# Patient Record
Sex: Male | Born: 1950 | Hispanic: No | Marital: Married | Smoking: Never smoker
Health system: Southern US, Community
[De-identification: ages and names within clinical notes are randomized; demographics above are authoritative.]

## PROBLEM LIST (undated history)

## (undated) DIAGNOSIS — I471 Supraventricular tachycardia, unspecified: Secondary | ICD-10-CM

## (undated) DIAGNOSIS — I4949 Other premature depolarization: Secondary | ICD-10-CM

## (undated) DIAGNOSIS — K5790 Diverticulosis of intestine, part unspecified, without perforation or abscess without bleeding: Secondary | ICD-10-CM

## (undated) DIAGNOSIS — K219 Gastro-esophageal reflux disease without esophagitis: Secondary | ICD-10-CM

## (undated) DIAGNOSIS — K824 Cholesterolosis of gallbladder: Secondary | ICD-10-CM

## (undated) HISTORY — DX: Supraventricular tachycardia: I47.1

## (undated) HISTORY — DX: Supraventricular tachycardia, unspecified: I47.10

## (undated) HISTORY — DX: Other premature depolarization: I49.49

## (undated) HISTORY — DX: Diverticulosis of intestine, part unspecified, without perforation or abscess without bleeding: K57.90

## (undated) HISTORY — PX: COLONOSCOPY: SHX174

## (undated) HISTORY — DX: Gastro-esophageal reflux disease without esophagitis: K21.9

## (undated) HISTORY — DX: Cholesterolosis of gallbladder: K82.4

## (undated) HISTORY — PX: OTHER SURGICAL HISTORY: SHX169

---

## 2001-09-06 ENCOUNTER — Encounter: Payer: Self-pay | Admitting: Family Medicine

## 2005-08-12 ENCOUNTER — Encounter: Payer: Self-pay | Admitting: Family Medicine

## 2008-04-03 ENCOUNTER — Encounter: Payer: Self-pay | Admitting: Family Medicine

## 2008-08-11 ENCOUNTER — Encounter: Payer: Self-pay | Admitting: Family Medicine

## 2009-03-13 ENCOUNTER — Encounter: Payer: Self-pay | Admitting: Family Medicine

## 2009-10-01 ENCOUNTER — Ambulatory Visit: Payer: Self-pay | Admitting: Family Medicine

## 2009-10-01 DIAGNOSIS — K5790 Diverticulosis of intestine, part unspecified, without perforation or abscess without bleeding: Secondary | ICD-10-CM

## 2009-10-01 DIAGNOSIS — K219 Gastro-esophageal reflux disease without esophagitis: Secondary | ICD-10-CM

## 2009-10-01 DIAGNOSIS — Z8719 Personal history of other diseases of the digestive system: Secondary | ICD-10-CM

## 2009-10-01 DIAGNOSIS — I4949 Other premature depolarization: Secondary | ICD-10-CM

## 2009-10-01 HISTORY — DX: Diverticulosis of intestine, part unspecified, without perforation or abscess without bleeding: K57.90

## 2009-10-01 HISTORY — DX: Other premature depolarization: I49.49

## 2009-10-01 HISTORY — DX: Gastro-esophageal reflux disease without esophagitis: K21.9

## 2009-10-08 ENCOUNTER — Ambulatory Visit: Payer: Self-pay | Admitting: Family Medicine

## 2010-04-05 ENCOUNTER — Ambulatory Visit: Payer: Self-pay | Admitting: Family Medicine

## 2010-04-05 LAB — CONVERTED CEMR LAB
Alkaline Phosphatase: 44 units/L (ref 39–117)
BUN: 15 mg/dL (ref 6–23)
Basophils Absolute: 0.1 10*3/uL (ref 0.0–0.1)
Basophils Relative: 1.1 % (ref 0.0–3.0)
Bilirubin, Direct: 0.3 mg/dL (ref 0.0–0.3)
Blood in Urine, dipstick: NEGATIVE
CO2: 29 meq/L (ref 19–32)
Calcium: 9.2 mg/dL (ref 8.4–10.5)
Cholesterol: 191 mg/dL (ref 0–200)
Creatinine, Ser: 0.8 mg/dL (ref 0.4–1.5)
Eosinophils Absolute: 0.1 10*3/uL (ref 0.0–0.7)
HDL: 59.5 mg/dL (ref >39.00)
LDL Cholesterol: 121 mg/dL — ABNORMAL HIGH (ref 0–99)
Lymphocytes Relative: 36 % (ref 12.0–46.0)
MCHC: 34.1 g/dL (ref 30.0–36.0)
Monocytes Absolute: 0.6 10*3/uL (ref 0.1–1.0)
Neutrophils Relative %: 50.2 % (ref 43.0–77.0)
Nitrite: NEGATIVE
PSA: 1.72 ng/mL (ref 0.10–4.00)
Platelets: 168 10*3/uL (ref 150.0–400.0)
RBC: 4.28 M/uL (ref 4.22–5.81)
Total Bilirubin: 2.6 mg/dL — ABNORMAL HIGH (ref 0.3–1.2)
Total CHOL/HDL Ratio: 3
Total Protein: 6.5 g/dL (ref 6.0–8.3)
Triglycerides: 51 mg/dL (ref 0.0–149.0)
Urobilinogen, UA: 0.2

## 2010-04-12 ENCOUNTER — Ambulatory Visit: Payer: Self-pay | Admitting: Family Medicine

## 2010-04-12 ENCOUNTER — Telehealth (INDEPENDENT_AMBULATORY_CARE_PROVIDER_SITE_OTHER): Payer: Self-pay | Admitting: *Deleted

## 2010-05-14 ENCOUNTER — Ambulatory Visit: Payer: Self-pay | Admitting: Family Medicine

## 2010-05-17 ENCOUNTER — Telehealth: Payer: Self-pay | Admitting: Internal Medicine

## 2010-05-18 ENCOUNTER — Telehealth: Payer: Self-pay | Admitting: Family Medicine

## 2010-05-19 ENCOUNTER — Ambulatory Visit: Payer: Self-pay | Admitting: Internal Medicine

## 2010-06-07 ENCOUNTER — Telehealth: Payer: Self-pay | Admitting: Family Medicine

## 2010-07-20 NOTE — Progress Notes (Signed)
Summary: appt with Dr Graciela Husbands  05/19/2010 at 9:15 am  Phone Note From Other Clinic Call back at Work Phone [phone]   Summary of Call: Spoke with Dr Johney Frame.  Pt had Holter monitor with ?runs of wider complex tachycardia.   Will set up EP referral and start metoprolol 25 mg two times a day  Initial call taken by: Evelena Peat MD,  May 17, 2010 2:04 PM  Follow-up for Phone Call        will schedule a NEP with Dr Graciela Husbands on [facility] 05/19/10.  Trying to reach pt now Dennis Bast, RN, BSN  May 17, 2010 2:15 PM  Called patient and left message on machine to call back about appointment and time  Sander Nephew, RN  Additional Follow-up for Phone Call Additional follow up Details #1::        Message left on work phone will sent Metoprolol to   pt pharmacy, begin med 2 times day. Additional Follow-up by: Sid Falcon LPN,  May 17, 2010 2:19 PM    Additional Follow-up for Phone Call Additional follow up Details #2::    pt rtn call Omer Jack  May 17, 2010 4:19 PM  pt confirm appt time.  Follow-up by: Claris Gladden RN,  May 18, 2010 4:11 PM  New/Updated Medications: METOPROLOL TARTRATE 25 MG TABS (METOPROLOL TARTRATE) one tab two times a day Prescriptions: METOPROLOL TARTRATE 25 MG TABS (METOPROLOL TARTRATE) one tab two times a day  #60 x 3   Entered by:   Sid Falcon LPN   Authorized by:   Evelena Peat MD   Signed by:   Sid Falcon LPN on 09/81/1914   Method used:   Electronically to        [id number] (retail)       [address]       [place], [place]  [address]       Ph: [phone]       Fax: [phone]   RxID:   [id number]

## 2010-07-20 NOTE — Letter (Signed)
Summary: [facility]   Imported By: Maryln Gottron 10/02/2009 14:16:30  _____________________________________________________________________  External Attachment:    Type:   Image     Comment:   External Document

## 2010-07-20 NOTE — Procedures (Signed)
Summary: Colonoscopy Report/[facility]  Colonoscopy Report/[facility]   Imported By: Maryln Gottron 10/02/2009 14:19:38  _____________________________________________________________________  External Attachment:    Type:   Image     Comment:   External Document

## 2010-07-20 NOTE — Assessment & Plan Note (Signed)
Summary: ?flu/njr   Vital Signs:  Patient profile:   60 year old male Weight:      153 pounds Temp:     99.6 degrees F oral BP sitting:   116 / 70  (left arm) Cuff size:   regular  Vitals Entered By: Kern Reap CMA Duncan Dull) (October 08, 2009 11:34 AM) CC: dry cough, sinus, congestion Is Patient Diabetic? No   CC:  dry cough, sinus, and congestion.  History of Present Illness: Andrew Griffith is a 60-year-old, married man nonsmoker, who comes in with a one-day history of fever, chills....... temperature 99.2........Marland Kitchen earache sore throat, nonproductive cough for one day.  Allergies: No Known Drug Allergies  Past History:  Past medical, surgical, family and social histories (including risk factors) reviewed for relevance to current acute and chronic problems.  Past Medical History: Reviewed history from 10/01/2009 and no changes required. Diverticulitis, hx of GERD Hay faver, allergies Heart arrhythmia  Family History: Reviewed history from 10/01/2009 and no changes required. Mother, breast cancer, emotional illness Father, lung cancer, elevated cholesterol, heart disease Grandparent, stroke, hypertension  Social History: Reviewed history from 10/01/2009 and no changes required. 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 Never Smoked Alcohol use-yes Regular exercise-yes  Review of Systems      See HPI  Physical Exam  General:  Well-developed,well-nourished,in no acute distress; alert,appropriate and cooperative throughout examination Head:  Normocephalic and atraumatic without obvious abnormalities. No apparent alopecia or balding. Eyes:  No corneal or conjunctival inflammation noted. EOMI. Perrla. Funduscopic exam benign, without hemorrhages, exudates or papilledema. Vision grossly normal. Ears:  External ear exam shows no significant lesions or deformities.  Otoscopic examination reveals clear canals, tympanic membranes are intact bilaterally without bulging, retraction,  inflammation or discharge. Hearing is grossly normal bilaterally. Nose:  External nasal examination shows no deformity or inflammation. Nasal mucosa are pink and moist without lesions or exudates. Mouth:  Oral mucosa and oropharynx without lesions or exudates.  Teeth in good repair. Neck:  No deformities, masses, or tenderness noted. Chest Wall:  No deformities, masses, tenderness or gynecomastia noted. Lungs:  Normal respiratory effort, chest expands symmetrically. Lungs are clear to auscultation, no crackles or wheezes.   Problems:  Medical Problems Added: 1)  Dx of Viral Infection-unspec  (ICD-079.99)  Impression & Recommendations:  Problem # 1:  VIRAL INFECTION-UNSPEC (ICD-079.99) Assessment New  His updated medication list for this problem includes:    Diclofenac Sodium 75 Mg Tbec (Diclofenac sodium) .Marland Kitchen... As needed    Hydromet 5-1.5 Mg/38ml Syrp (Hydrocodone-homatropine) .Marland Kitchen... 1 or 2 tsps three times a day as needed  Complete Medication List: 1)  Omeprazole 20 Mg Tbec (Omeprazole) .... Once daily 2)  Ranitidine Hcl 150 Mg Caps (Ranitidine hcl) .... As needed 3)  Diclofenac Sodium 75 Mg Tbec (Diclofenac sodium) .... As needed 4)  Hydromet 5-1.5 Mg/18ml Syrp (Hydrocodone-homatropine) .Marland Kitchen.. 1 or 2 tsps three times a day as needed  Patient Instructions: 1)  drink 20 to 30 ounces of water daily, run a vaporizer or humidifier in y   bedroom at night, 2)  Hydromet one to 2 teaspoons 3 times a day as needed for cough and cold.  Return p.r.n. Prescriptions: HYDROMET 5-1.5 MG/5ML SYRP (HYDROCODONE-HOMATROPINE) 1 or 2 tsps three times a day as needed  #8oz x 1   Entered and Authorized by:   Roderick Pee MD   Signed by:   Roderick Pee MD on 10/08/2009   Method used:   Print then Give to Patient  RxID:   [id number]

## 2010-07-20 NOTE — Progress Notes (Signed)
Summary: 48 hr holter monitor  Phone Note Outgoing Call Call back at [facility] Phone [phone]   Call placed by: Stanton Kidney, EMT-P,  April 12, 2010 5:14 PM Summary of Call: Left message for Pt. to call back to schedule for holter monitor. Stanton Kidney, EMT-P  April 12, 2010 5:15 PM  Pt scheduled for 05/07/10 at 4pm for 48 hr holter. Stanton Kidney, EMT-P  April 13, 2010 5:07 PM

## 2010-07-20 NOTE — Assessment & Plan Note (Signed)
Summary: cpx/njr rsc bmp/njr----PT [facility] // RS   Vital Signs:  Patient profile:   60 year old male Height:      70 inches Weight:      155 pounds Temp:     98.2 degrees F oral Pulse rate:   60 / minute Pulse rhythm:   regular Resp:     12 per minute BP sitting:   110 / 70  (left arm) Cuff size:   regular  Vitals Entered By: Sid Falcon LPN (April 12, 2010 2:10 PM)  History of Present Illness: Here for CPE.  PMH, SH, AND FH  reviewed. Inconsistent exercise.  Pt has had several months if not years of palpitations.  Sometimes has sensation of rapid  beat and sometimes slow heart rate.  No clear triggers.  minimal caffeine use. No syncope, chest pain, or any severe dizziness.  No clear exacerbating factors. Prior EKGs unremarkable.  No hx of Holter or any other workup.  Symptoms seem to be  progressive in nature.  His father did have CAD at 43 but, again, pt with no symptoms of  chest pain.  Clinical Review Panels:  Prevention   Last Colonoscopy:  normal (09/18/2001)   Last PSA:  1.72 (04/05/2010)  Immunizations   Last Tetanus Booster:  Historical (09/19/2003)  Lipid Management   Cholesterol:  191 (04/05/2010)   LDL (bad choesterol):  121 (04/05/2010)   HDL (good cholesterol):  59.50 (04/05/2010)  Diabetes Management   Creatinine:  0.8 (04/05/2010)  CBC   WBC:  5.6 (04/05/2010)   RBC:  4.28 (04/05/2010)   Hgb:  14.6 (04/05/2010)   Hct:  42.8 (04/05/2010)   Platelets:  168.0 (04/05/2010)   MCV  100.0 (04/05/2010)   MCHC  34.1 (04/05/2010)   RDW  12.6 (04/05/2010)   PMN:  50.2 (04/05/2010)   Lymphs:  36.0 (04/05/2010)   Monos:  11.4 (04/05/2010)   Eosinophils:  1.3 (04/05/2010)   Basophil:  1.1 (04/05/2010)  Complete Metabolic Panel   Glucose:  80 (04/05/2010)   Sodium:  140 (04/05/2010)   Potassium:  4.9 (04/05/2010)   Chloride:  103 (04/05/2010)   CO2:  29 (04/05/2010)   BUN:  15 (04/05/2010)   Creatinine:  0.8 (04/05/2010)   Albumin:  4.2  (04/05/2010)   Total Protein:  6.5 (04/05/2010)   Calcium:  9.2 (04/05/2010)   Total Bili:  2.6 (04/05/2010)   Alk Phos:  44 (04/05/2010)   SGPT (ALT):  21 (04/05/2010)   SGOT (AST):  21 (04/05/2010)   Allergies (verified): No Known Drug Allergies  Past History:  Past Medical History: Last updated: 10/01/2009 Diverticulitis, hx of GERD Hay faver, allergies Heart arrhythmia  Family History: Last updated: 04/12/2010 Mother, breast cancer, emotional illness Father, lung cancer, elevated cholesterol, heart disease (age 65) Grandparent, stroke, hypertension  Social History: Last updated: 10/01/2009 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 Never Smoked Alcohol use-yes Regular exercise-yes  Risk Factors: Exercise: yes (10/01/2009)  Risk Factors: Smoking Status: never (10/01/2009) PMH-FH-SH reviewed for relevance  Family History: Mother, breast cancer, emotional illness Father, lung cancer, elevated cholesterol, heart disease (age 31) Grandparent, stroke, hypertension  Review of Systems  The patient denies anorexia, fever, weight loss, weight gain, vision loss, decreased hearing, hoarseness, chest pain, syncope, dyspnea on exertion, peripheral edema, prolonged cough, headaches, hemoptysis, abdominal pain, melena, hematochezia, severe indigestion/heartburn, hematuria, incontinence, genital sores, muscle weakness, suspicious skin lesions, transient blindness, difficulty walking, depression, unusual weight change, abnormal bleeding, enlarged lymph nodes, and testicular masses.  symptoms of heart irregularity off and on.    No assoc dizziness or syncope.  Symptoms have progressed recently.   Physical Exam  General:  Well-developed,well-nourished,in no acute distress; alert,appropriate and cooperative throughout examination Head:  Normocephalic and atraumatic without obvious abnormalities. No apparent alopecia or balding. Eyes:  No corneal or conjunctival inflammation  noted. EOMI. Perrla. Funduscopic exam benign, without hemorrhages, exudates or papilledema. Vision grossly normal. Ears:  External ear exam shows no significant lesions or deformities.  Otoscopic examination reveals clear canals, tympanic membranes are intact bilaterally without bulging, retraction, inflammation or discharge. Hearing is grossly normal bilaterally. Mouth:  Oral mucosa and oropharynx without lesions or exudates.  Teeth in good repair. Neck:  No deformities, masses, or tenderness noted. Lungs:  Normal respiratory effort, chest expands symmetrically. Lungs are clear to auscultation, no crackles or wheezes. Heart:  Normal rate and regular rhythm. S1 and S2 normal without gallop, murmur, click, rub or other extra sounds. Abdomen:  Bowel sounds positive,abdomen soft and non-tender without masses, organomegaly or hernias noted. Rectal:  No external abnormalities noted. Normal sphincter tone. No rectal masses or tenderness. Prostate:  Prostate gland firm and smooth, no enlargement, nodularity, tenderness, mass, asymmetry or induration. Msk:  No deformity or scoliosis noted of thoracic or lumbar spine.   Extremities:  No clubbing, cyanosis, edema, or deformity noted with normal full range of motion of all joints.   Neurologic:  alert & oriented X3 and cranial nerves II-XII intact.   Skin:  no rashes and no suspicious lesions.   Cervical Nodes:  No lymphadenopathy noted Psych:  Cognition and judgment appear intact. Alert and cooperative with normal attention span and concentration. No apparent delusions, illusions, hallucinations   Impression & Recommendations:  Problem # 1:  Preventive Health Care (ICD-V70.0) discussed exercise and immuinizations reviewed.  Declines flu.  He will check on coverage for  Shingles vaccine.  HEmoccults given and will need repeat colonoscopy in 2 years.  Problem # 2:  PALPITATIONS (ICD-785.1) Assessment: Deteriorated set up event monitor given progression  of symptoms.  Limit caffeine.  Suspect symptomatic PVCs. Orders: Cardiology Referral (Cardiology)  Complete Medication List: 1)  Omeprazole 20 Mg Tbec (Omeprazole) .... Once daily 2)  Ranitidine Hcl 150 Mg Caps (Ranitidine hcl) .... As needed 3)  Diclofenac Sodium 75 Mg Tbec (Diclofenac sodium) .... As needed  Patient Instructions: 1)  Check on coverage for shingles vaccine (Zostavax) 2)  It is important that you exercise reguarly at least 20 minutes 5 times a week. If you develop chest pain, have severe difficulty breathing, or feel very tired, stop exercising immediately and seek medical attention.    Orders Added: 1)  Est. Patient 40-64 years [99396] 2)  Cardiology Referral [Cardiology] 3)  Est. Patient Level III [09811]

## 2010-07-20 NOTE — Procedures (Signed)
Summary: summary report  summary report   Imported By: Mirna Mires 05/19/2010 13:50:39  _____________________________________________________________________  External Attachment:    Type:   Image     Comment:   External Document

## 2010-07-20 NOTE — Assessment & Plan Note (Signed)
Summary: BRAND NEW PT/TO EST/CJR   Vital Signs:  Patient profile:   60 year old male Height:      70 inches Weight:      154 pounds BMI:     22.18 Temp:     98.2 degrees F oral Pulse rate:   60 / minute Pulse rhythm:   regular Resp:     12 per minute BP sitting:   122 / 86  (left arm) Cuff size:   regular  Vitals Entered By: Sid Falcon LPN (October 01, 2009 9:20 AM) CC: New to establish   History of Present Illness: New patient to establish care  Past medical history significant for GERD, diverticulitis and possibly heart arrhythmia (which sounds like some PVCs). This has never been evaluated. No progressive symptoms. Rare episodes of palpitations not associated with chest pain, tachycardia, dizziness. Reflux well controlled with omeprazole and supplements with Zantac as needed.  ? episode of acute diverticulitis few years ago but apparently not confirmed by CT scan.  Dxed clinically. Family history significant mother breast cancer, father lung cancer. Father had coronary disease age 42.  Patient is married and has 2 grown sons. No history of smoking. Occ alcohol use. 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   Preventive Screening-Counseling & Management  Alcohol-Tobacco     Smoking Status: never  Caffeine-Diet-Exercise     Does Patient Exercise: yes  Allergies (verified): No Known Drug Allergies  Past History:  Family History: Last updated: 10/01/2009 Mother, breast cancer, emotional illness Father, lung cancer, elevated cholesterol, heart disease Grandparent, stroke, hypertension  Social History: Last updated: 10/01/2009 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 Never Smoked Alcohol use-yes Regular exercise-yes  Risk Factors: Exercise: yes (10/01/2009)  Risk Factors: Smoking Status: never (10/01/2009)  Past Medical History: Diverticulitis, hx of GERD Hay faver, allergies Heart arrhythmia  Family History: Mother, breast cancer, emotional  illness Father, lung cancer, elevated cholesterol, heart disease Grandparent, stroke, hypertension  Social History: 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    Physical Exam  General:  Well-developed,well-nourished,in no acute distress; alert,appropriate and cooperative throughout examination Head:  Normocephalic and atraumatic without obvious abnormalities. No apparent alopecia or balding. Eyes:  No corneal or conjunctival inflammation noted. EOMI. Perrla. Funduscopic exam benign, without hemorrhages, exudates or papilledema. Vision grossly normal. Ears:  External ear exam shows no significant lesions or deformities.  Otoscopic examination reveals clear canals, tympanic membranes are intact bilaterally without bulging, retraction, inflammation or discharge. Hearing is grossly normal bilaterally. Mouth:  Oral mucosa and oropharynx without lesions or exudates.  Teeth in good repair. Neck:  No deformities, masses, or tenderness noted. Lungs:  Normal respiratory effort, chest expands symmetrically. Lungs are clear to auscultation, no crackles or wheezes. Heart:  Normal rate and regular rhythm. S1 and S2 normal without gallop, murmur, click, rub or other extra sounds. Abdomen:  Bowel sounds positive,abdomen soft and non-tender without masses, organomegaly or hernias noted. Extremities:  No clubbing, cyanosis, edema, or deformity noted with normal full range of motion of all joints.   Neurologic:  No cranial nerve deficits noted.  Station and gait are normal. Plantar reflexes are down-going bilaterally.  DTRs are symmetrical throughout. Sensory, motor and coordinative functions appear intact. Skin:  Intact without suspicious lesions or rashes Cervical Nodes:  No lymphadenopathy noted Psych:  good eye contact, not anxious appearing, and not depressed appearing.     Impression & Recommendations:  Problem # 1:  GERD (ICD-530.81)  His updated medication list for this problem includes:    Omeprazole 20 Mg Tbec (Omeprazole) ..... Once daily    Ranitidine Hcl 150 Mg Caps (Ranitidine hcl) .Marland Kitchen... 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  Avoid excessive caffeine use.  Problem # 4:  Preventive Health Care (ICD-V70.0) last CPE 9/10.  Schedule CPE.  Complete Medication List: 1)  Omeprazole 20 Mg Tbec (Omeprazole) .... Once daily 2)  Ranitidine Hcl 150 Mg Caps (Ranitidine hcl) .... As needed 3)  Diclofenac Sodium 75 Mg Tbec (Diclofenac sodium) .... As needed  Patient Instructions: 1)  Schedule complete physical examination in 5-6 months 2)  It is important that you exercise reguarly at least 20 minutes 5 times a week. If you develop chest pain, have severe difficulty breathing, or feel very tired, stop exercising immediately and seek medical attention.   Preventive Care Screening  Last Tetanus Booster:    Date:  09/19/2003    Results:  Historical   Colonoscopy:    Date:  09/18/2001    Results:  normal     Preventive Care Screening  Last Tetanus Booster:    Date:  09/19/2003    Results:  Historical   Colonoscopy:    Date:  09/18/2001    Results:  normal

## 2010-07-20 NOTE — Assessment & Plan Note (Signed)
Summary: wide complex tach on monitor/kl   Primary Provider:  Evelena Peat MD  CC:  Follow up on Holter results; No complaints.  History of Present Illness: [name] is seen at the request of Dr. Caryl Never because of tachycardia palpitations and a Holter monitor documenting supraventricular tachycardia.  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 They are frog negative and are typically triggered by exertion and relieved by stopping exercise. There also provoked by caffeine. He is not use over-the-counter cold medicines.  He has not been previously documented. He did undergo an ultrasound he thinks in [place] 8-10 years ago. He does not recall whether this is normal or not normal.  Otherwise he denies exercise induced chest discomfort or intolerance. He does not have nocturnal dyspnea orthopnea or peripheral edema.  Problems Prior to Update: 1)  Routine General Medical Exam[facility]  (ICD-V70.0) 2)  Palpitations  (ICD-785.1) 3)  Premature Ventricular Contractions  (ICD-427.69) 4)  Gerd  (ICD-530.81) 5)  Diverticulitis, Hx of  (ICD-V12.79)  Medications Prior to Update: 1)  Omeprazole 20 Mg Tbec (Omeprazole) .... Once Daily 2)  Ranitidine Hcl 150 Mg Caps (Ranitidine Hcl) .... As Needed 3)  Diclofenac Sodium 75 Mg Tbec (Diclofenac Sodium) .... As Needed 4)  Metoprolol Tartrate 25 Mg Tabs (Metoprolol Tartrate) .... One Tab Two Times A Day  Current Medications (verified): 1)  Omeprazole 20 Mg Tbec (Omeprazole) .... Once Daily 2)  Ranitidine Hcl 150 Mg Caps (Ranitidine Hcl) .... As Needed 3)  Diclofenac Sodium 75 Mg Tbec (Diclofenac Sodium) .... As Needed 4)  Metoprolol Tartrate 25 Mg Tabs (Metoprolol Tartrate) .... One Tab Two Times A Day (Not Started Yet) 5)  Aspirin 81 Mg Tbec (Aspirin) .... Take  One Tablet By Mouth Daily  Allergies (verified): No Known Drug Allergies  Past History:  Past Medical History: Last updated: 10/01/2009 Diverticulitis, hx of GERD Hay faver, allergies Heart arrhythmia  Family History: Last updated: 04/12/2010 Mother, breast cancer, emotional illness Father, lung cancer, elevated cholesterol, heart disease (age 2) Grandparent, stroke, hypertension  Social History: Last updated: 10/01/2009 Occupation:  [employer] Married Never Smoked Alcohol use-yes Regular exercise-yes  Risk Factors: Exercise: yes (10/01/2009)  Risk Factors: Smoking Status: never (10/01/2009)  Review of Systems       full review of systems was negative apart from a history of present illness and past medical history.   Vital Signs:  Patient profile:   60 year old male Height:      70 inches Weight:      155.75 pounds BMI:     22.43 Pulse rate:   50 / minute BP sitting:   110 / 58  (left arm) Cuff size:   regular  Vitals Entered By: Stanton Kidney, EMT-P (May 19, 2010 9:34 AM)  Physical Exam  General:  Well developed, well nourished,older Caucasian male appearing his stated age in no acute distress. Head:  normal HEENT Neck:  supple without thyromegaly Chest Wall:  without kyphosis scoliosis or CVA tenderness Lungs:  clear to auscultation Heart:  regular rate and rhythm without murmurs or gallops Abdomen:  soft nontender without midline pulsation or hepatomegaly Msk:  without obvious skeletal deformity Pulses:  intact distal pulses Extremities:  no clubbing cyanosis or edema Neurologic:  alert and oriented and grossly normal sensory and motor function Skin:  warm  and dry and without rashes Cervical Nodes:  without adenopathy Psych:  normal affect   EKG  Procedure date:  05/19/2010  Findings:      sinus rhythm without evidence of ventricular preexcitation  Holter Monitor  Procedure date:  05/19/2010  Findings:      the patient has  documented wide-complex tachycardia giving rise to a narrow complex tachycardia at the same cycle length. This confirms both SVT and suggested this is a right-sided or septal pathway.  Impression & Recommendations:  Problem # 1:  SVT (ICD-427.0) the patient has documented supraventricular tachycardia. It initiates as a wide-complex tachycardia and then transitions to a narrow QRS tachycardia of the same cycle length. This both confirmed the diagnosis of SVT as well suggests there is a right-sided/septal pathway.   I reviewed with him the potential therapies including beta blockers and her calcium blockers which are somewhat of a problem because of baseline bradycardia as well as antiarrhythmic drug therapy with the possibility of arrhythmia and catheter ablation. Given his minimal symptoms at this point notwithstanding his rapid rate, he would like to do nothing ; I think that this is a reasonable strategy. We will plan to reassess things in 6 months. His updated medication list for this problem includes:    Metoprolol Tartrate 25 Mg Tabs (Metoprolol tartrate) ..... One tab two times a day (not started yet)    Aspirin 81 Mg Tbec (Aspirin) .Marland Kitchen... Take one tablet by mouth daily  Problem # 2:  SINUS BRADYCARDIA (ICD-427.81) as above His updated medication list for this problem includes:    Metoprolol Tartrate 25 Mg Tabs (Metoprolol tartrate) ..... One tab two times a day (not started yet)    Aspirin 81 Mg Tbec (Aspirin) .Marland Kitchen... Take one tablet by mouth daily  Patient Instructions: 1)  Your physician recommends that you continue on your current medications as directed. Please refer to the Current Medication list given to you today. 2)  Your physician wants you to follow-up in:  6 months with Dr. Graciela Husbands. You will receive a reminder letter in the mail two months in advance. If you don't receive a letter, please call our office to schedule the follow-up appointment.

## 2010-07-20 NOTE — Progress Notes (Signed)
Summary: call ASAP please  Phone Note Call from Patient   Caller: Patient Call For: Evelena Peat MD Summary of Call: Pt has not been able to touch base with Dr. Caryl Never or the Cardiologist regarding his recent testing and appts.  Please call ASAP with any information. [phone] Initial call taken by: Lynann Beaver CMA AAMA,  May 18, 2010 11:18 AM  Follow-up for Phone Call        I called pt and made him aware of his appt with Dr. Graciela Husbands on 11/30. I asked him if he still wanted to talk with Dr. Caryl Never and he said no, he will wait and see what Dr. Graciela Husbands says. Follow-up by: Corky Mull,  May 18, 2010 12:17 PM

## 2010-07-22 NOTE — Progress Notes (Signed)
Summary: Omeprazole to [facility] X 1 year  Phone Note Call from Patient   Summary of Call: [place] physican prescribed Omeprazole, moved here and needs refill to [facility].  Spoke with pt, faxed Rx Initial call taken by: Sid Falcon LPN,  June 07, 2010 2:03 PM    Prescriptions: OMEPRAZOLE 20 MG TBEC (OMEPRAZOLE) once daily  #90 x 3   Entered by:   Sid Falcon LPN   Authorized by:   Evelena Peat MD   Signed by:   Sid Falcon LPN on 04/54/0981   Method used:   Faxed to ...       [facility] (mail-order)             , [place]         Ph: [phone]       Fax: [phone]   RxID:   [id number]

## 2011-02-04 ENCOUNTER — Encounter: Payer: Self-pay | Admitting: Family Medicine

## 2011-02-07 ENCOUNTER — Encounter: Payer: Self-pay | Admitting: Family Medicine

## 2011-02-07 ENCOUNTER — Ambulatory Visit (INDEPENDENT_AMBULATORY_CARE_PROVIDER_SITE_OTHER): Payer: 59 | Admitting: Family Medicine

## 2011-02-07 ENCOUNTER — Ambulatory Visit: Payer: Self-pay | Admitting: Family Medicine

## 2011-02-07 VITALS — BP 110/70 | Temp 98.7°F | Wt 154.0 lb

## 2011-02-07 DIAGNOSIS — M545 Low back pain: Secondary | ICD-10-CM

## 2011-02-07 MED ORDER — DICLOFENAC SODIUM 75 MG PO TBEC: 75.0000 mg | DELAYED_RELEASE_TABLET | Freq: Two times a day (BID) | ORAL | Status: DC

## 2011-02-07 NOTE — Progress Notes (Signed)
  Subjective:    Patient ID: Andrew Griffith, male    DOB: May 11, 1951, 60 y.o.   MRN: [id number]  HPI Low back, hip and leg pain for the past few months. Symptoms are not progressive. Long history of intermittent back difficulties. Denies any urine incontinence, weakness, or numbness. He is able to tolerate walking fairly well. Also exercising with elliptical. Pain is starting to wake up occasionally. He denies any fever or chills. No history of cancer. No appetite or weight changes. Symptoms are mild to moderate in terms of severity   Review of Systems  Constitutional: Negative for fever, activity change and appetite change.  Respiratory: Negative for cough and shortness of breath.   Cardiovascular: Negative for chest pain and leg swelling.  Gastrointestinal: Negative for vomiting and abdominal pain.  Genitourinary: Negative for dysuria, hematuria and flank pain.  Musculoskeletal: Positive for back pain. Negative for joint swelling.  Neurological: Negative for weakness and numbness.       Objective:   Physical Exam  Constitutional: He appears well-developed and well-nourished.  Cardiovascular: Normal rate and regular rhythm.   Pulmonary/Chest: Effort normal and breath sounds normal. No respiratory distress. He has no wheezes. He has no rales.  Musculoskeletal: He exhibits no edema.       Straight leg raise is negative bilaterally. Left hip reveals excellent range of motion. No lateral hip tenderness. Back is nontender to palpation with good range of motion  Neurological:       No weakness lower extremities. Deep tender reflexes symmetric knee and ankle. No sensory impairment          Assessment & Plan:  Low back pain with left lower extremity pain. Suspect he does have some radiculopathy symptoms but nonfocal neurologic exam. Diclofenac 75 mg twice daily with food. Aerobic exercise as tolerated. Reviewed extension stretches. Touch base 2 weeks if no better

## 2011-02-07 NOTE — Patient Instructions (Signed)
Touch base in 2 weeks if no better and sooner for any worsening symptoms.

## 2011-04-25 ENCOUNTER — Encounter: Payer: Self-pay | Admitting: Internal Medicine

## 2011-04-25 ENCOUNTER — Ambulatory Visit (INDEPENDENT_AMBULATORY_CARE_PROVIDER_SITE_OTHER): Payer: 59 | Admitting: Internal Medicine

## 2011-04-25 VITALS — BP 132/76 | HR 52 | Resp 18 | Ht 70.0 in | Wt 155.1 lb

## 2011-04-25 DIAGNOSIS — I471 Supraventricular tachycardia, unspecified: Secondary | ICD-10-CM | POA: Insufficient documentation

## 2011-04-25 NOTE — Progress Notes (Signed)
  HPI  Andrew Griffith is a 60 y.o. male Seen in followup for recurrent tachycardia palpitations with documented SVT. These have been increasingly frequent occurring a couple times a week lasting only a minute or so. They are mildly symptomatic but without symptoms of lightheadedness or presyncope  Past Medical History  Diagnosis Date  . PREMATURE VENTRICULAR CONTRACTIONS 10/01/2009  . GERD 10/01/2009  . DIVERTICULITIS, HX OF 10/01/2009    No past surgical history on file.  Current Outpatient Prescriptions  Medication Sig Dispense Refill  . aspirin 81 MG tablet Take 81 mg by mouth daily.        Marland Kitchen omeprazole (PRILOSEC) 20 MG capsule Take 20 mg by mouth daily.        . ranitidine (ZANTAC) 150 MG capsule Take 150 mg by mouth 2 (two) times daily as needed.          No Known Allergies  Review of Systems negative except from HPI and PMH  Physical Exam Well developed and well nourished in no acute distress HENT normal E scleral and icterus clear Neck Supple JVP flat; carotids brisk and full Clear to ausculation Regular rate and rhythm, no murmurs gallops or rub Soft with active bowel sounds No clubbing cyanosis and edema Alert and oriented, grossly normal motor and sensory function Skin Warm and Dry  ECG  Assessment and  Plan

## 2011-04-25 NOTE — Assessment & Plan Note (Addendum)
TheseThese have been recurring. We have reviewed vagal maneuvers. We have reviewed the benign nature of these palpitations. He will let us know what he would like to do.  We reviewed treatment options  AV nodal blocking agents are precluded by his resting bradycardia. Antiarrhythmic therapy and the potential for poor arrhythmia as well as catheter ablation

## 2011-04-27 ENCOUNTER — Other Ambulatory Visit (INDEPENDENT_AMBULATORY_CARE_PROVIDER_SITE_OTHER): Payer: 59

## 2011-04-27 DIAGNOSIS — Z0000 Encounter for general adult medical examination without abnormal findings: Secondary | ICD-10-CM

## 2011-04-27 LAB — LIPID PANEL
Cholesterol: 205 mg/dL — ABNORMAL HIGH (ref 0–200)
HDL: 65.4 mg/dL (ref >39.00)
Total CHOL/HDL Ratio: 3
Triglycerides: 48 mg/dL (ref 0.0–149.0)
VLDL: 9.6 mg/dL (ref 0.0–40.0)

## 2011-04-27 LAB — POCT URINALYSIS DIPSTICK
Blood, UA: NEGATIVE
Glucose, UA: NEGATIVE
Nitrite, UA: NEGATIVE
Protein, UA: NEGATIVE
Spec Grav, UA: 1.025
Urobilinogen, UA: 0.2

## 2011-04-27 LAB — CBC WITH DIFFERENTIAL/PLATELET
Basophils Absolute: 0 10*3/uL (ref 0.0–0.1)
Eosinophils Absolute: 0.1 10*3/uL (ref 0.0–0.7)
Eosinophils Relative: 1.1 % (ref 0.0–5.0)
HCT: 45.6 % (ref 39.0–52.0)
Lymphs Abs: 1.7 10*3/uL (ref 0.7–4.0)
MCV: 100.8 fl — ABNORMAL HIGH (ref 78.0–100.0)
Monocytes Absolute: 0.6 10*3/uL (ref 0.1–1.0)
Neutrophils Relative %: 53.2 % (ref 43.0–77.0)
Platelets: 182 10*3/uL (ref 150.0–400.0)
RDW: 12.8 % (ref 11.5–14.6)
WBC: 5 10*3/uL (ref 4.5–10.5)

## 2011-04-27 LAB — BASIC METABOLIC PANEL
BUN: 18 mg/dL (ref 6–23)
Chloride: 102 mEq/L (ref 96–112)
Creatinine, Ser: 0.9 mg/dL (ref 0.4–1.5)
Glucose, Bld: 91 mg/dL (ref 70–99)

## 2011-04-27 LAB — TSH: TSH: 0.98 u[IU]/mL (ref 0.35–5.50)

## 2011-04-27 LAB — HEPATIC FUNCTION PANEL
Bilirubin, Direct: 0.2 mg/dL (ref 0.0–0.3)
Total Bilirubin: 2.1 mg/dL — ABNORMAL HIGH (ref 0.3–1.2)

## 2011-04-29 ENCOUNTER — Other Ambulatory Visit: Payer: 59

## 2011-05-04 ENCOUNTER — Encounter: Payer: 59 | Admitting: Family Medicine

## 2011-05-06 ENCOUNTER — Encounter: Payer: 59 | Admitting: Family Medicine

## 2011-05-11 ENCOUNTER — Ambulatory Visit (INDEPENDENT_AMBULATORY_CARE_PROVIDER_SITE_OTHER): Payer: 59 | Admitting: Family Medicine

## 2011-05-11 ENCOUNTER — Encounter: Payer: Self-pay | Admitting: Family Medicine

## 2011-05-11 VITALS — BP 118/80 | HR 60 | Temp 98.3°F | Resp 12 | Ht 70.5 in | Wt 155.0 lb

## 2011-05-11 DIAGNOSIS — Z0000 Encounter for general adult medical examination without abnormal findings: Secondary | ICD-10-CM

## 2011-05-11 NOTE — Patient Instructions (Signed)
Check on coverage for shingles vaccine. We will be setting up referral for colonoscopy.

## 2011-05-11 NOTE — Progress Notes (Signed)
  Subjective:    Patient ID: Andrew Griffith, male    DOB: 04-13-1951, 60 y.o.   MRN: [id number]  HPI  Patient seen for complete physical. He has history of GERD and has had history of paroxysmal supraventricular tachycardia. No recent episodes. Has seen cardiologist. Consideration for low-dose beta blocker but he tends to have low resting pulse. He has been exercising regularly and has not had any problems recently with syncope, dizziness, or any chest pain.  Tetanus up to date. No history of shingles vaccine. Flu vaccine up-to-date. Colonoscopy 2003 and due for repeat soon. He would like to go ahead and schedule.  Past Medical History  Diagnosis Date  . PREMATURE VENTRICULAR CONTRACTIONS 10/01/2009  . GERD 10/01/2009  . DIVERTICULITIS, HX OF 10/01/2009  . Paroxysmal supraventricular tachycardia    No past surgical history on file.  reports that he has never smoked. He does not have any smokeless tobacco history on file. His alcohol and drug histories not on file. family history includes Cancer in his father and mother; Cancer (age of onset:70) in his sister; Heart disease (age of onset:51) in his father; Hyperlipidemia in his father; Hypertension in his other; and Stroke in his other. No Known Allergies   Review of Systems  Constitutional: Negative for fever, activity change, appetite change and fatigue.  HENT: Negative for ear pain, congestion and trouble swallowing.   Eyes: Negative for pain and visual disturbance.  Respiratory: Negative for cough, shortness of breath and wheezing.   Cardiovascular: Negative for chest pain and palpitations.  Gastrointestinal: Negative for nausea, vomiting, abdominal pain, diarrhea, constipation, blood in stool, abdominal distention and rectal pain.  Genitourinary: Negative for dysuria, hematuria and testicular pain.  Musculoskeletal: Negative for joint swelling and arthralgias.  Skin: Negative for rash.  Neurological: Negative for dizziness, syncope and  headaches.  Hematological: Negative for adenopathy.  Psychiatric/Behavioral: Negative for confusion and dysphoric mood.       Objective:   Physical Exam  Constitutional: He is oriented to person, place, and time. He appears well-developed and well-nourished. No distress.  HENT:  Head: Normocephalic and atraumatic.  Right Ear: External ear normal.  Left Ear: External ear normal.  Mouth/Throat: Oropharynx is clear and moist.  Eyes: Conjunctivae and EOM are normal. Pupils are equal, round, and reactive to light.  Neck: Normal range of motion. Neck supple. No thyromegaly present.  Cardiovascular: Normal rate, regular rhythm and normal heart sounds.   No murmur heard. Pulmonary/Chest: No respiratory distress. He has no wheezes. He has no rales.  Abdominal: Soft. Bowel sounds are normal. He exhibits no distension and no mass. There is no tenderness. There is no rebound and no guarding.  Musculoskeletal: He exhibits no edema.  Lymphadenopathy:    He has no cervical adenopathy.  Neurological: He is alert and oriented to person, place, and time. He displays normal reflexes. No cranial nerve deficit.  Skin: No rash noted.  Psychiatric: He has a normal mood and affect.          Assessment & Plan:  Health maintenance. Labs reviewed with patient all basically favorable. Check on coverage for shingles vaccine and return for that if patient desires. Schedule repeat colonoscopy for April 2013 which will make 10 year interval.

## 2011-06-02 ENCOUNTER — Ambulatory Visit (INDEPENDENT_AMBULATORY_CARE_PROVIDER_SITE_OTHER): Payer: 59 | Admitting: Family Medicine

## 2011-06-02 DIAGNOSIS — Z23 Encounter for immunization: Secondary | ICD-10-CM

## 2011-09-23 ENCOUNTER — Encounter: Payer: Self-pay | Admitting: Gastroenterology

## 2011-10-19 ENCOUNTER — Ambulatory Visit (AMBULATORY_SURGERY_CENTER): Payer: 59 | Admitting: *Deleted

## 2011-10-19 ENCOUNTER — Encounter: Payer: Self-pay | Admitting: Gastroenterology

## 2011-10-19 VITALS — Ht 70.0 in | Wt 154.1 lb

## 2011-10-19 DIAGNOSIS — Z1211 Encounter for screening for malignant neoplasm of colon: Secondary | ICD-10-CM

## 2011-10-19 MED ORDER — PEG-KCL-NACL-NASULF-NA ASC-C 100 G PO SOLR: ORAL | Status: DC

## 2011-11-02 ENCOUNTER — Encounter: Payer: 59 | Admitting: Gastroenterology

## 2011-11-04 ENCOUNTER — Ambulatory Visit (AMBULATORY_SURGERY_CENTER): Payer: 59 | Admitting: Gastroenterology

## 2011-11-04 ENCOUNTER — Encounter: Payer: Self-pay | Admitting: Gastroenterology

## 2011-11-04 VITALS — BP 120/74 | HR 58 | Temp 97.9°F | Resp 12 | Ht 70.0 in | Wt 154.0 lb

## 2011-11-04 DIAGNOSIS — K5730 Diverticulosis of large intestine without perforation or abscess without bleeding: Secondary | ICD-10-CM

## 2011-11-04 DIAGNOSIS — Z1211 Encounter for screening for malignant neoplasm of colon: Secondary | ICD-10-CM

## 2011-11-04 MED ORDER — SODIUM CHLORIDE 0.9 % IV SOLN: 500.0000 mL | INTRAVENOUS | Status: DC

## 2011-11-04 NOTE — Progress Notes (Signed)
No complaints noted in the recovery room. Maw   

## 2011-11-04 NOTE — Patient Instructions (Signed)
Handouts given on diverticulosis and high fiber diet.  You may resume your prior medications today.  Please call if any questions or concerns.     YOU HAD AN ENDOSCOPIC PROCEDURE TODAY AT THE [facility]: Refer to the procedure report that was given to you for any specific questions about what was found during the examination.  If the procedure report does not answer your questions, please call your gastroenterologist to clarify.  If you requested that your care partner not be given the details of your procedure findings, then the procedure report has been included in a sealed envelope for you to review at your convenience later.  YOU SHOULD EXPECT: Some feelings of bloating in the abdomen. Passage of more gas than usual.  Walking can help get rid of the air that was put into your GI tract during the procedure and reduce the bloating. If you had a lower endoscopy (such as a colonoscopy or flexible sigmoidoscopy) you may notice spotting of blood in your stool or on the toilet paper. If you underwent a bowel prep for your procedure, then you may not have a normal bowel movement for a few days.  DIET: Your first meal following the procedure should be a light meal and then it is ok to progress to your normal diet.  A half-sandwich or bowl of soup is an example of a good first meal.  Heavy or fried foods are harder to digest and may make you feel nauseous or bloated.  Likewise meals heavy in dairy and vegetables can cause extra gas to form and this can also increase the bloating.  Drink plenty of fluids but you should avoid alcoholic beverages for 24 hours.  ACTIVITY: Your care partner should take you home directly after the procedure.  You should plan to take it easy, moving slowly for the rest of the day.  You can resume normal activity the day after the procedure however you should NOT DRIVE or use heavy machinery for 24 hours (because of the sedation medicines used during the test).     SYMPTOMS TO REPORT IMMEDIATELY: A gastroenterologist can be reached at any hour.  During normal business hours, 8:30 AM to 5:00 PM Monday through Friday, call [phone].  After hours and on weekends, please call the GI answering service at [phone] who will take a message and have the physician on call contact you.   Following lower endoscopy (colonoscopy or flexible sigmoidoscopy):  Excessive amounts of blood in the stool  Significant tenderness or worsening of abdominal pains  Swelling of the abdomen that is new, acute  Fever of 100F or higher    FOLLOW UP: If any biopsies were taken you will be contacted by phone or by letter within the next 1-3 weeks.  Call your gastroenterologist if you have not heard about the biopsies in 3 weeks.  Our staff will call the home number listed on your records the next business day following your procedure to check on you and address any questions or concerns that you may have at that time regarding the information given to you following your procedure. This is a courtesy call and so if there is no answer at the home number and we have not heard from you through the emergency physician on call, we will assume that you have returned to your regular daily activities without incident.  SIGNATURES/CONFIDENTIALITY: You and/or your care partner have signed paperwork which will be entered into your electronic medical record.  These signatures attest to the fact that that the information above on your After Visit Summary has been reviewed and is understood.  Full responsibility of the confidentiality of this discharge information lies with you and/or your care-partner.  

## 2011-11-04 NOTE — Progress Notes (Signed)
Patient did not experience any of the following events: a burn prior to discharge; a fall within the facility; wrong site/side/patient/procedure/implant event; or a hospital transfer or hospital admission upon discharge from the facility. (G8907)Patient did not have preoperative order for IV antibiotic SSI prophylaxis. (G8918)    No complaints noted in the recovery room. Maw   

## 2011-11-04 NOTE — Op Note (Signed)
[facility] [address] [place], [place]  [address]  COLONOSCOPY PROCEDURE REPORT  PATIENT:  Andrew, Griffith  MR#:  [id number] BIRTHDATE:  19-Nov-1950, 60 yrs. old  GENDER:  male ENDOSCOPIST:  Vania Rea. Jarold Motto, MD, [facility] REF. BY:  Evelena Peat, M.D. PROCEDURE DATE:  11/04/2011 PROCEDURE:  Average-risk screening colonoscopy G0121 ASA CLASS:  Class II INDICATIONS:  Colorectal cancer screening, average risk MEDICATIONS:   propofol (Diprivan) 220 mg IV  DESCRIPTION OF PROCEDURE:   After the risks and benefits and of the procedure were explained, informed consent was obtained. Digital rectal exam was performed and revealed no abnormalities. The LB [id number] endoscope was introduced through the anus and advanced to the cecum, which was identified by both the appendix and ileocecal valve.  The quality of the prep was excellent, using MoviPrep.  The instrument was then slowly withdrawn as the colon was fully examined. <<PROCEDUREIMAGES>>  FINDINGS:  There were mild diverticular changes in left colon. diverticulosis was found.  No polyps or cancers were seen.  This was otherwise a normal examination of the colon.   Retroflexed views in the rectum revealed no abnormalities.    The scope was then withdrawn from the patient and the procedure completed.  COMPLICATIONS:  None ENDOSCOPIC IMPRESSION: 1) Diverticulosis,mild,left sided diverticulosis 2) No polyps or cancers 3) Otherwise normal examination RECOMMENDATIONS: 1) High fiber diet. 2) Continue current colorectal screening recommendations for "routine risk" patients with a repeat colonoscopy in 10 years.  REPEAT EXAM:  No  ______________________________ Vania Rea. Jarold Motto, MD, [facility]  CC:  n. eSIGNED:   Vania Rea. Latrel Szymczak at 11/04/2011 02:25 PM  Alger Memos, [phone]

## 2011-11-07 ENCOUNTER — Telehealth: Payer: Self-pay

## 2011-11-07 NOTE — Telephone Encounter (Signed)
  Follow up Call-  Call back number 11/04/2011  Post procedure Call Back phone  # [phone]  Permission to leave phone message Yes     Patient questions:  Do you have a fever, pain , or abdominal swelling? no Pain Score  0 *  Have you tolerated food without any problems? yes  Have you been able to return to your normal activities? yes  Do you have any questions about your discharge instructions: Diet   no Medications  no Follow up visit  no  Do you have questions or concerns about your Care? no  Actions: * If pain score is 4 or above: No action needed, pain <4.

## 2012-05-04 ENCOUNTER — Encounter: Payer: Self-pay | Admitting: Family Medicine

## 2012-05-04 ENCOUNTER — Ambulatory Visit (INDEPENDENT_AMBULATORY_CARE_PROVIDER_SITE_OTHER): Payer: 59 | Admitting: Family Medicine

## 2012-05-04 VITALS — BP 120/70 | Temp 97.8°F | Wt 156.0 lb

## 2012-05-04 DIAGNOSIS — K219 Gastro-esophageal reflux disease without esophagitis: Secondary | ICD-10-CM

## 2012-05-04 NOTE — Progress Notes (Signed)
Subjective:     Patient ID: Andrew Griffith, male   DOB: 07/02/1950, 61 y.o.   MRN: [id number]  HPI 61 year old with history of GERD and diverticulosis here for evaluation of epigastric pain.  States that pain has been occurring shortly after meals for approximately the past week.  Describes pain as burning, at times sharp, and with gas cramps and passing gas.  Does not radiate, and denies vomiting, dysphagia, melena or grossly bloody stool.  Notes that he shortly before symptoms started he had attended a business conference where he ate rich food and drank alcohol, and had a burger and several beers with neighbors.  Takes omeprazole daily and ranitidine at night as needed.  Ranitidine and reducing EtOH and caffeine intake this week have helped.  Colonoscopy in May of this year unremarkable.    Review of Systems  Constitutional: Negative for fever, appetite change and unexpected weight change.  Respiratory: Negative for chest tightness and shortness of breath.   Cardiovascular: Negative for chest pain.  Gastrointestinal: Positive for abdominal pain. Negative for vomiting, diarrhea, constipation and blood in stool.       No hematemsis, dysphagia or melena.       Objective:   Physical Exam  Constitutional: He is oriented to person, place, and time. He appears well-developed and well-nourished. No distress.  HENT:  Head: Normocephalic and atraumatic.  Cardiovascular: Normal rate, regular rhythm and normal heart sounds.   Pulmonary/Chest: Effort normal and breath sounds normal. No respiratory distress.  Abdominal: Soft. Bowel sounds are normal. He exhibits no distension and no mass. There is no tenderness. There is no rebound and no guarding.  Neurological: He is alert and oriented to person, place, and time.  Skin: Skin is warm and dry.  Psychiatric: He has a normal mood and affect.       Assessment:     61 year old with history of GERD and diverticulitis here for evaluation of 1 week of  epigastric pain.    Plan:     1. Epigastric pain: likely GERD exacerbation, given pt's history of GERD, intake of rich foods and alcohol just prior to onset of symptoms, and that reduction in EtOH and caffeine this week has improved symptoms.  No red flags of dysphagia, hematemesis, unintended weight loss, melena or bloody stool at this time.  Continue lifestyle modifications already begun this week, and take ranitidine each night for the next week.  Provide info re: additional modifications.  Follow up in next 1-2 weeks if these changes do not improve symptoms, or symptoms worsen.  Marthann Schiller, MS3     Agree with assessment and plan as per Marthann Schiller, MS 3 Evelena Peat MD

## 2012-05-04 NOTE — Patient Instructions (Addendum)
Gastroesophageal Reflux Disease, Adult  Gastroesophageal reflux disease (GERD) happens when acid from your stomach flows up into the esophagus. When acid comes in contact with the esophagus, the acid causes soreness (inflammation) in the esophagus. Over time, GERD may create small holes (ulcers) in the lining of the esophagus.  CAUSES    Increased body weight. This puts pressure on the stomach, making acid rise from the stomach into the esophagus.   Smoking. This increases acid production in the stomach.   Drinking alcohol. This causes decreased pressure in the lower esophageal sphincter (valve or ring of muscle between the esophagus and stomach), allowing acid from the stomach into the esophagus.   Late evening meals and a full stomach. This increases pressure and acid production in the stomach.   A malformed lower esophageal sphincter.  Sometimes, no cause is found.  SYMPTOMS    Burning pain in the lower part of the mid-chest behind the breastbone and in the mid-stomach area. This may occur twice a week or more often.   Trouble swallowing.   Sore throat.   Dry cough.   Asthma-like symptoms including chest tightness, shortness of breath, or wheezing.  DIAGNOSIS   Your caregiver may be able to diagnose GERD based on your symptoms. In some cases, X-rays and other tests may be done to check for complications or to check the condition of your stomach and esophagus.  TREATMENT   Your caregiver may recommend over-the-counter or prescription medicines to help decrease acid production. Ask your caregiver before starting or adding any new medicines.   HOME CARE INSTRUCTIONS    Change the factors that you can control. Ask your caregiver for guidance concerning weight loss, quitting smoking, and alcohol consumption.   Avoid foods and drinks that make your symptoms worse, such as:   Caffeine or alcoholic drinks.   Chocolate.   Peppermint or mint flavorings.   Garlic and onions.   Spicy foods.   Citrus fruits,  such as oranges, lemons, or limes.   Tomato-based foods such as sauce, chili, salsa, and pizza.   Fried and fatty foods.   Avoid lying down for the 3 hours prior to your bedtime or prior to taking a nap.   Eat small, frequent meals instead of large meals.   Wear loose-fitting clothing. Do not wear anything tight around your waist that causes pressure on your stomach.   Raise the head of your bed 6 to 8 inches with wood blocks to help you sleep. Extra pillows will not help.   Only take over-the-counter or prescription medicines for pain, discomfort, or fever as directed by your caregiver.   Do not take aspirin, ibuprofen, or other nonsteroidal anti-inflammatory drugs (NSAIDs).  SEEK IMMEDIATE MEDICAL CARE IF:    You have pain in your arms, neck, jaw, teeth, or back.   Your pain increases or changes in intensity or duration.   You develop nausea, vomiting, or sweating (diaphoresis).   You develop shortness of breath, or you faint.   Your vomit is green, yellow, black, or looks like coffee grounds or blood.   Your stool is red, bloody, or black.  These symptoms could be signs of other problems, such as heart disease, gastric bleeding, or esophageal bleeding.  MAKE SURE YOU:    Understand these instructions.   Will watch your condition.   Will get help right away if you are not doing well or get worse.  Document Released: 03/16/2005 Document Revised: 08/29/2011 Document Reviewed: 12/24/2010  ExitCare Patient   Information 2013 [employer].  Diet for Gastroesophageal Reflux Disease, Adult  Reflux (acid reflux) is when acid from your stomach flows up into the esophagus. When acid comes in contact with the esophagus, the acid causes irritation and soreness (inflammation) in the esophagus. When reflux happens often or so severely that it causes damage to the esophagus, it is called gastroesophageal reflux disease (GERD). Nutrition therapy can help ease the discomfort of GERD.  FOODS OR DRINKS TO AVOID OR  LIMIT   Smoking or chewing tobacco. Nicotine is one of the most potent stimulants to acid production in the gastrointestinal tract.   Caffeinated and decaffeinated coffee and black tea.   Regular or low-calorie carbonated beverages or energy drinks (caffeine-free carbonated beverages are allowed).    Strong spices, such as black pepper, white pepper, red pepper, cayenne, curry powder, and chili powder.   Peppermint or spearmint.   Chocolate.   High-fat foods, including meats and fried foods. Extra added fats including oils, butter, salad dressings, and nuts. Limit these to less than 8 tsp per day.   Fruits and vegetables if they are not tolerated, such as citrus fruits or tomatoes.   Alcohol.   Any food that seems to aggravate your condition.  If you have questions regarding your diet, call your caregiver or a registered dietitian.  OTHER THINGS THAT MAY HELP GERD INCLUDE:    Eating your meals slowly, in a relaxed setting.   Eating 5 to 6 small meals per day instead of 3 large meals.   Eliminating food for a period of time if it causes distress.   Not lying down until 3 hours after eating a meal.   Keeping the head of your bed raised 6 to 9 inches (15 to 23 cm) by using a foam wedge or blocks under the legs of the bed. Lying flat may make symptoms worse.   Being physically active. Weight loss may be helpful in reducing reflux in overweight or obese adults.   Wear loose fitting clothing  EXAMPLE MEAL PLAN  This meal plan is approximately 2,000 calories based on ChooseMyPlate.gov meal planning guidelines.  Breakfast    cup cooked oatmeal.   1 cup strawberries.   1 cup low-fat milk.   1 oz almonds.  Snack   1 cup cucumber slices.   6 oz yogurt (made from low-fat or fat-free milk).  Lunch   2 slice whole-wheat bread.   2 oz sliced turkey.   2 tsp mayonnaise.   1 cup blueberries.   1 cup snap peas.  Snack   6 whole-wheat crackers.   1 oz string cheese.  Dinner    cup brown rice.   1  cup mixed veggies.   1 tsp olive oil.   3 oz grilled fish.  Document Released: 06/06/2005 Document Revised: 08/29/2011 Document Reviewed: 04/22/2011  ExitCare Patient Information 2013 [employer].

## 2012-05-30 ENCOUNTER — Telehealth: Payer: Self-pay | Admitting: Family Medicine

## 2012-05-30 MED ORDER — DICLOFENAC SODIUM 75 MG PO TBEC: 75.0000 mg | DELAYED_RELEASE_TABLET | ORAL | Status: DC | PRN

## 2012-05-30 NOTE — Telephone Encounter (Signed)
Pt would like refill of VOLTAREN 75mg  for joint pain. Pulled muscle and this helped last time. Pharm [facility] /[address]

## 2012-06-07 ENCOUNTER — Ambulatory Visit (INDEPENDENT_AMBULATORY_CARE_PROVIDER_SITE_OTHER): Payer: 59 | Admitting: Family Medicine

## 2012-06-07 ENCOUNTER — Encounter: Payer: Self-pay | Admitting: Family Medicine

## 2012-06-07 VITALS — BP 110/78 | Temp 98.3°F | Wt 155.0 lb

## 2012-06-07 DIAGNOSIS — J069 Acute upper respiratory infection, unspecified: Secondary | ICD-10-CM

## 2012-06-07 MED ORDER — HYDROCODONE-HOMATROPINE 5-1.5 MG/5ML PO SYRP: 5.0000 mL | ORAL_SOLUTION | Freq: Four times a day (QID) | ORAL | Status: AC | PRN

## 2012-06-07 MED ORDER — AZITHROMYCIN 250 MG PO TABS: ORAL_TABLET | ORAL | Status: AC

## 2012-06-07 NOTE — Patient Instructions (Addendum)
Viral Syndrome  You or your child has Viral Syndrome. It is the most common infection causing "colds" and infections in the nose, throat, sinuses, and breathing tubes. Sometimes the infection causes nausea, vomiting, or diarrhea. The germ that causes the infection is a virus. No antibiotic or other medicine will kill it. There are medicines that you can take to make you or your child more comfortable.   HOME CARE INSTRUCTIONS    Rest in bed until you start to feel better.   If you have diarrhea or vomiting, eat small amounts of crackers and toast. Soup is helpful.   Do not give aspirin or medicine that contains aspirin to children.   Only take over-the-counter or prescription medicines for pain, discomfort, or fever as directed by your caregiver.  SEEK IMMEDIATE MEDICAL CARE IF:    You or your child has not improved within one week.   You or your child has pain that is not at least partially relieved by over-the-counter medicine.   Thick, colored mucus or blood is coughed up.   Discharge from the nose becomes thick yellow or green.   Diarrhea or vomiting gets worse.   There is any major change in your or your child's condition.   You or your child develops a skin rash, stiff neck, severe headache, or are unable to hold down food or fluid.   You or your child has an oral temperature above 102 F (38.9 C), not controlled by medicine.   Your baby is older than 3 months with a rectal temperature of 102 F (38.9 C) or higher.   Your baby is 3 months old or younger with a rectal temperature of 100.4 F (38 C) or higher.  Document Released: 05/22/2006 Document Revised: 08/29/2011 Document Reviewed: 05/23/2007  ExitCare Patient Information 2013 [employer].

## 2012-06-07 NOTE — Progress Notes (Signed)
  Subjective:    Patient ID: Andrew Griffith, male    DOB: [birth year], 61 y.o.   MRN: [id number]  HPI  Acute visit. Started couple days ago. Sinus drainage, mild sore throat and cough. Increase sinus pressure. Used NyQuil at night with good relief of cough. No fever. Occasional headaches. Only mild body aches. Nonsmoker. No nausea or vomiting. Patient concerned because of upcoming travel to [facility] next week.   Review of Systems As per history of present illness    Objective:   Physical Exam  HENT:  Right Ear: External ear normal.  Left Ear: External ear normal.  Mouth/Throat: Oropharynx is clear and moist.  Neck: Neck supple. No thyromegaly present.  Cardiovascular: Normal rate and regular rhythm.   Pulmonary/Chest: Effort normal and breath sounds normal. No respiratory distress. He has no wheezes. He has no rales.  Lymphadenopathy:    He has no cervical adenopathy.          Assessment & Plan:  URI. Suspect viral. Have not recommended antibiotics at this time. Hycodan cough syrup for nighttime use as needed. Start Zithromax only if develops fever or worsening symptoms over the next several days

## 2012-06-27 ENCOUNTER — Encounter: Payer: Self-pay | Admitting: Internal Medicine

## 2012-06-27 ENCOUNTER — Ambulatory Visit (INDEPENDENT_AMBULATORY_CARE_PROVIDER_SITE_OTHER): Payer: 59 | Admitting: Internal Medicine

## 2012-06-27 VITALS — BP 117/76 | HR 53 | Ht 71.0 in | Wt 157.4 lb

## 2012-06-27 DIAGNOSIS — I471 Supraventricular tachycardia: Secondary | ICD-10-CM

## 2012-06-27 DIAGNOSIS — I498 Other specified cardiac arrhythmias: Secondary | ICD-10-CM

## 2012-06-27 NOTE — Progress Notes (Signed)
  HPI  Andrew Griffith is a 62 y.o. male Seen in followup for recurrent tachycardia palpitations with documented SVT. These have been increasingly frequent occurring a couple times a week lasting only a minute or so. They are mildly symptomatic but without symptoms of lightheadedness or presyncope  No chest pain sob or edema  Past Medical History  Diagnosis Date  . PREMATURE VENTRICULAR CONTRACTIONS 10/01/2009  . GERD 10/01/2009  . DIVERTICULITIS, HX OF 10/01/2009  . Paroxysmal supraventricular tachycardia     Past Surgical History  Procedure Date  . No prior surgery     Current Outpatient Prescriptions  Medication Sig Dispense Refill  . aspirin 81 MG tablet Take 81 mg by mouth daily.        . B Complex Vitamins (B COMPLEX 1 PO) Take by mouth daily.      . diclofenac (VOLTAREN) 75 MG EC tablet Take 1 tablet (75 mg total) by mouth as needed.  30 tablet  0  . Multiple Vitamin (MULTIVITAMIN) tablet Take 1 tablet by mouth daily.      Marland Kitchen omeprazole (PRILOSEC) 20 MG capsule Take 20 mg by mouth daily.        . ranitidine (ZANTAC) 150 MG capsule Take 150 mg by mouth 2 (two) times daily as needed.          No Known Allergies  Review of Systems negative except from HPI and PMH  Physical Exam Well developed and well nourished in no acute distress HENT normal E scleral and icterus clear Neck Supple JVP flat; carotids brisk and full Clear to ausculation Regular rate and rhythm, no murmurs gallops or rub Soft with active bowel sounds No clubbing cyanosis and edema Alert and oriented, grossly normal motor and sensory function Skin Warm and Dry  ECG isinus a 50 Intervals 16/10/42 & axis XCVIII RSR prime Otherwise normal  Assessment and  Plan

## 2012-06-27 NOTE — Assessment & Plan Note (Signed)
stble  Continue current course

## 2012-06-27 NOTE — Patient Instructions (Signed)
Your physician wants you to follow-up in: 1 year with Dr. Klein. You will receive a reminder letter in the mail two months in advance. If you don't receive a letter, please call our office to schedule the follow-up appointment.  Your physician recommends that you continue on your current medications as directed. Please refer to the Current Medication list given to you today.  

## 2012-08-04 ENCOUNTER — Other Ambulatory Visit: Payer: Self-pay

## 2013-03-06 ENCOUNTER — Ambulatory Visit (INDEPENDENT_AMBULATORY_CARE_PROVIDER_SITE_OTHER): Payer: 59 | Admitting: Family Medicine

## 2013-03-06 ENCOUNTER — Encounter: Payer: Self-pay | Admitting: Family Medicine

## 2013-03-06 VITALS — BP 110/72 | HR 55 | Wt 155.0 lb

## 2013-03-06 DIAGNOSIS — M7581 Other shoulder lesions, right shoulder: Secondary | ICD-10-CM

## 2013-03-06 DIAGNOSIS — M67919 Unspecified disorder of synovium and tendon, unspecified shoulder: Secondary | ICD-10-CM

## 2013-03-06 DIAGNOSIS — R1013 Epigastric pain: Secondary | ICD-10-CM

## 2013-03-06 NOTE — Patient Instructions (Addendum)
Rotator Cuff Tendonitis  The rotator cuff is the collection of all the muscles and tendons (the supraspinatus, infraspinatus, subscapularis, and teres minor muscles and their tendons) that help your shoulder stay in place. This unit holds the head of the upper arm bone (humerus) in the cup (fossa) of the shoulder blade (scapula). Basically, it connects the arm to the shoulder. Tendinitis is a swelling and irritation of the tissue, called cord like structures (tendons) that connect muscle to bone. It usually is caused by overusing the joint involved. When the tissue surrounding a tendon (the synovium) becomes inflamed, it is called tenosynovitis. This also is often the result of overuse in people whose jobs require repetitive (over and over again) types of motion. HOME CARE INSTRUCTIONS   Use a sling or splint for as long as directed by your caregiver until the pain decreases.  Apply ice to the injury for 15-20 minutes, 3-4 times per day. Put the ice in a plastic bag and place a towel between the bag of ice and your skin.  Try to avoid use other than gentle range of motion while your shoulder is painful. Use and exercise only as directed by your caregiver. Stop exercises or range of motion if pain or discomfort increases, unless directed otherwise by your caregiver.  Only take over-the-counter or prescription medicines for pain, discomfort, or fever as directed by your caregiver.  If you were give a shoulder sling and straps (immobilizer), do not remove it except as directed, or until you see a caregiver for a follow-up examination. If you need to remove it, move your arm as little as possible or as directed.  You may want to sleep on several pillows at night to lessen swelling and pain. SEEK IMMEDIATE MEDICAL CARE IF:   Pain in your shoulder increases or new pain develops in your arm, hand, or fingers and is not relieved with medications.  You develop new, unexplained symptoms, especially  increased numbness in the hands or loss of strength, or you develop any worsening of the problems which brought you in for care.  Your arm, hand, or fingers are numb or tingling.  Your arm, hand, or fingers are swollen, painful, or turn white or blue. Document Released: 08/27/2003 Document Revised: 08/29/2011 Document Reviewed: 04/03/2008 [facility] Patient Information 2014 [place], [place].  Call or touch base in 2 weeks if shoulder pain not improving

## 2013-03-06 NOTE — Progress Notes (Signed)
  Subjective:    Patient ID: Andrew Griffith, male    DOB: 1951/04/18, 62 y.o.   MRN: [id number]  HPI  Right shoulder pain.  Almost one year noted after pulling together units of table together. Almost daily pain since then. Worse with lifting.  Location is diffuse-lateral.  No neck pain. ? Weakness with lifting. Possible mild relief with Diclofenac. He has not noted any restricted range of motion. Denies any radiculopathy symptoms. No upper extremity weakness or numbness.  Patient relates second issue of midepigastric burning. He states this occurs about 75% of days. He currently takes omeprazole and Zantac regularly (for GERD). He denies any appetite or weight changes. No dysphagia. Location is epigastric and without radiation. No nausea or vomiting. No melena. No regular nonsteroidal use.  Past Medical History  Diagnosis Date  . PREMATURE VENTRICULAR CONTRACTIONS 10/01/2009  . GERD 10/01/2009  . DIVERTICULITIS, HX OF 10/01/2009  . Paroxysmal supraventricular tachycardia    Past Surgical History  Procedure Laterality Date  . No prior surgery      reports that he has never smoked. He has never used smokeless tobacco. He reports that he drinks about 4.2 ounces of alcohol per week. He reports that he does not use illicit drugs. family history includes Breast cancer in his mother; Cancer in his father and mother; Cancer (age of onset: 46) in his sister; Heart disease (age of onset: 61) in his father; Hyperlipidemia in his father; Hypertension in his other; Lung cancer in his father; Stroke in his other. There is no history of Colon cancer or Stomach cancer. No Known Allergies   Review of Systems  Constitutional: Negative for appetite change and unexpected weight change.  HENT: Negative for neck pain.   Gastrointestinal: Positive for abdominal pain. Negative for nausea, vomiting, diarrhea, constipation, blood in stool and abdominal distention.  Neurological: Negative for numbness.        Objective:   Physical Exam  Constitutional: He appears well-developed and well-nourished.  HENT:  Mouth/Throat: Oropharynx is clear and moist.  Neck: Neck supple. No thyromegaly present.  Cardiovascular: Normal rate and regular rhythm.   Pulmonary/Chest: Effort normal and breath sounds normal. No respiratory distress. He has no wheezes. He has no rales.  Abdominal: Soft. He exhibits no mass. There is no tenderness.  Musculoskeletal: He exhibits no edema.  No upper extremity atrophy. He does have evidence for previous left biceps rupture. He has full range of motion. No a.c. joint tenderness. No biceps tenderness. Patient has pain with abduction greater than 100 against resistance. Some pain with internal rotation and external rotation as well. No rotator cuff weakness.  Neurological:  No upper extremity weakness. Upper extremity reflexes are symmetric          Assessment & Plan:  #1 right rotator cuff tendinitis. Chronic. No evidence for weakness. No adhesive capsulitis.   Discussed risks and benefits of corticosteroid injection and patient consented.  After prepping skin with betadine, injected 40 mg depomedrol and 2 cc of plain xylocaine with 23 gauge one and one half inch needle using posterior lateral approach and pt tolerated well. Instructed range of motion. Touch base one to 2 weeks if no better #2 persistent epigastric abdominal pain in a patient taking regular omeprazole and ranitidine. He does not have any red flags such as appetite or weight changes or evidence for acute bleed. Set up GI referral for further evaluation

## 2013-03-07 ENCOUNTER — Encounter: Payer: Self-pay | Admitting: Family Medicine

## 2013-03-11 ENCOUNTER — Encounter: Payer: Self-pay | Admitting: Gastroenterology

## 2013-04-02 ENCOUNTER — Encounter: Payer: Self-pay | Admitting: Family Medicine

## 2013-04-04 ENCOUNTER — Ambulatory Visit (INDEPENDENT_AMBULATORY_CARE_PROVIDER_SITE_OTHER): Payer: 59 | Admitting: Gastroenterology

## 2013-04-04 ENCOUNTER — Encounter: Payer: Self-pay | Admitting: Gastroenterology

## 2013-04-04 VITALS — BP 96/62 | HR 64 | Ht 70.0 in | Wt 153.1 lb

## 2013-04-04 DIAGNOSIS — K219 Gastro-esophageal reflux disease without esophagitis: Secondary | ICD-10-CM

## 2013-04-04 DIAGNOSIS — R1013 Epigastric pain: Secondary | ICD-10-CM

## 2013-04-04 NOTE — Progress Notes (Signed)
History of Present Illness:  This is a 62 year old Caucasian male who presents with aching epigastric discomfort worse over the last 6 months.  Gives a history of chronic GERD and takes Prilosec in the morning and Zantac at bedtime, and denies reflux symptoms at this time.  He does occasionally have some solid food dysphagia.  He's concerned about his aching discomfort in the subxiphoid epigastric area which persist is made worse by spicy foods.  His had no hepatobiliary symptomatology.  He has regular bowel movements without melena or hematochezia.  Previous colonoscopy was unremarkable.  Labs shows CBC a normal SMA-20.  Family history is noncontributory.  He denies a specific food intolerances or weight loss.  Patient not had previous endoscopy, upper GI series, or ultrasounds or CT scans.  I have reviewed this patient's present history, medical and surgical past history, allergies and medications.     ROS:   All systems were reviewed and are negative unless otherwise stated in the HPI.    Physical Exam: Blood pressure 96/62, pulse 64 and weight 153 General well developed well nourished patient in no acute distress, appearing their stated age Eyes PERRLA, no icterus, fundoscopic exam per opthamologist Skin no lesions noted Neck supple, no adenopathy, no thyroid enlargement, no tenderness Chest clear to percussion and auscultation Heart no significant murmurs, gallops or rubs noted Abdomen no hepatosplenomegaly masses or tenderness, BS normal.Extremities no acute joint lesions, edema, phlebitis or evidence of cellulitis. Neurologic patient oriented x 3, cranial nerves intact, no focal neurologic deficits noted. Psychological mental status normal and normal affect.  Assessment and plan: Chronic GERD well-controlled on daily acid suppressive therapy.  He may have H. pylori infection and chronic gastritis.  I've scheduled him for endoscopy with appropriate biopsies.  Further diagnostic and  therapeutic actions will depend on endoscopic exam.  He may need upper abdominal ultrasound examination

## 2013-04-04 NOTE — Patient Instructions (Signed)

## 2013-04-05 ENCOUNTER — Encounter: Payer: Self-pay | Admitting: Gastroenterology

## 2013-04-05 NOTE — Addendum Note (Signed)
Addended by: Kristian Covey on: 04/05/2013 08:27 AM   Modules accepted: Orders

## 2013-04-15 ENCOUNTER — Encounter: Payer: Self-pay | Admitting: Gastroenterology

## 2013-04-15 ENCOUNTER — Ambulatory Visit (AMBULATORY_SURGERY_CENTER): Payer: 59 | Admitting: Gastroenterology

## 2013-04-15 ENCOUNTER — Other Ambulatory Visit: Payer: Self-pay | Admitting: *Deleted

## 2013-04-15 VITALS — BP 123/79 | HR 53 | Temp 97.6°F | Resp 12 | Ht 70.0 in | Wt 153.0 lb

## 2013-04-15 DIAGNOSIS — R1013 Epigastric pain: Secondary | ICD-10-CM

## 2013-04-15 MED ORDER — SODIUM CHLORIDE 0.9 % IV SOLN: 500.0000 mL | INTRAVENOUS | Status: DC

## 2013-04-15 NOTE — Progress Notes (Signed)
Scheduled pt for U/S at [address], [facility], 04/19/13, arrive 08:15am, NPO after midnight. Instructions given to [facility].

## 2013-04-15 NOTE — Progress Notes (Signed)
Patient did not have preoperative order for IV antibiotic SSI prophylaxis. (G8918)  Patient did not experience any of the following events: a burn prior to discharge; a fall within the facility; wrong site/side/patient/procedure/implant event; or a hospital transfer or hospital admission upon discharge from the facility. (G8907)  

## 2013-04-15 NOTE — Op Note (Signed)
[facility] [address] [place], [address]   ENDOSCOPY PROCEDURE REPORT  PATIENT: Andrew Griffith, Andrew Griffith  MR#: [id number] BIRTHDATE: 11-18-1950 , 62  yrs. old GENDER: Male ENDOSCOPIST:Shamariah Shewmake Hale Bogus, MD, Clement[facility] Graham REFERRED BY: Evelena Peat, M.D. PROCEDURE DATE:  04/15/2013 PROCEDURE:   EGD w/ biopsy for H.pylori ASA CLASS:    Class II INDICATIONS: Dyspepsia. MEDICATION: propofol (Diprivan) 150mg  IV TOPICAL ANESTHETIC:   Cetacaine Spray  DESCRIPTION OF PROCEDURE:   After the risks and benefits of the procedure were explained, informed consent was obtained.  The LB [id number]  endoscope was introduced through the mouth  and advanced to the second portion of the duodenum .  The instrument was slowly withdrawn as the mucosa was fully examined.    The upper, middle and distal third of the esophagus were carefully inspected and no abnormalities were noted.  The z-line was well seen at the GEJ.  The endoscope was pushed into the fundus which was normal including a retroflexed view.  The antrum, gastric body, first and second part of the duodenum were unremarkable.Scattered small hyperplastic fundal  polyps noted  Retroflexed views revealed no abnormalities.    The scope was then withdrawn from the patient and the procedure completed.  COMPLICATIONS: There were no complications.   ENDOSCOPIC IMPRESSION: Normal EGD .Marland KitchenMarland KitchenCLO Bx. 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  RECOMMENDATIONS: 1.  Await biopsy results 2.  Rx CLO if positive 3.  My office will arrange for you to have an abdominal ultrasound performed.    _______________________________ eSigned:  Mardella Layman, MD, [facility] 04/15/2013 8:39 AM   standard discharge

## 2013-04-15 NOTE — Patient Instructions (Addendum)
Impressions/recommendations:  Normal EGD Await biopsy results.  Abdominal ultrasound scheduled for Friday October 31 at [facility] [address] [place]. Arrive at 815 am. Nothing to eat or drink after midnight.    YOU HAD AN ENDOSCOPIC PROCEDURE TODAY AT THE [facility]: Refer to the procedure report that was given to you for any specific questions about what was found during the examination.  If the procedure report does not answer your questions, please call your gastroenterologist to clarify.  If you requested that your care partner not be given the details of your procedure findings, then the procedure report has been included in a sealed envelope for you to review at your convenience later.  YOU SHOULD EXPECT: Some feelings of bloating in the abdomen. Passage of more gas than usual.  Walking can help get rid of the air that was put into your GI tract during the procedure and reduce the bloating. If you had a lower endoscopy (such as a colonoscopy or flexible sigmoidoscopy) you may notice spotting of blood in your stool or on the toilet paper. If you underwent a bowel prep for your procedure, then you may not have a normal bowel movement for a few days.  DIET: Your first meal following the procedure should be a light meal and then it is ok to progress to your normal diet.  A half-sandwich or bowl of soup is an example of a good first meal.  Heavy or fried foods are harder to digest and may make you feel nauseous or bloated.  Likewise meals heavy in dairy and vegetables can cause extra gas to form and this can also increase the bloating.  Drink plenty of fluids but you should avoid alcoholic beverages for 24 hours.  ACTIVITY: Your care partner should take you home directly after the procedure.  You should plan to take it easy, moving slowly for the rest of the day.  You can resume normal activity the day after the procedure however you should NOT DRIVE or use heavy  machinery for 24 hours (because of the sedation medicines used during the test).    SYMPTOMS TO REPORT IMMEDIATELY: A gastroenterologist can be reached at any hour.  During normal business hours, 8:30 AM to 5:00 PM Monday through Friday, call [phone].  After hours and on weekends, please call the GI answering service at [phone] who will take a message and have the physician on call contact you.    Following upper endoscopy (EGD)  Vomiting of blood or coffee ground material  New chest pain or pain under the shoulder blades  Painful or persistently difficult swallowing  New shortness of breath  Fever of 100F or higher  Black, tarry-looking stools  FOLLOW UP: If any biopsies were taken you will be contacted by phone or by letter within the next 1-3 weeks.  Call your gastroenterologist if you have not heard about the biopsies in 3 weeks.  Our staff will call the home number listed on your records the next business day following your procedure to check on you and address any questions or concerns that you may have at that time regarding the information given to you following your procedure. This is a courtesy call and so if there is no answer at the home number and we have not heard from you through the emergency physician on call, we will assume that you have returned to your regular daily activities without incident.  SIGNATURES/CONFIDENTIALITY: You and/or your care partner have  signed paperwork which will be entered into your electronic medical record.  These signatures attest to the fact that that the information above on your After Visit Summary has been reviewed and is understood.  Full responsibility of the confidentiality of this discharge information lies with you and/or your care-partner.

## 2013-04-15 NOTE — Progress Notes (Signed)
Called to room to assist during endoscopic procedure.  Patient ID and intended procedure confirmed with present staff. Received instructions for my participation in the procedure from the performing physician.  

## 2013-04-16 ENCOUNTER — Telehealth: Payer: Self-pay | Admitting: *Deleted

## 2013-04-16 ENCOUNTER — Encounter: Payer: Self-pay | Admitting: Gastroenterology

## 2013-04-16 NOTE — Telephone Encounter (Signed)
  Follow up Call-  Call back number 04/15/2013 11/04/2011  Post procedure Call Back phone  # [phone]  Permission to leave phone message Yes Yes     Patient questions:  Do you have a fever, pain , or abdominal swelling? no Pain Score  0 *  Have you tolerated food without any problems? yes  Have you been able to return to your normal activities? yes  Do you have any questions about your discharge instructions: Diet   no Medications  no Follow up visit  no  Do you have questions or concerns about your Care? no  Actions: * If pain score is 4 or above: No action needed, pain <4.

## 2013-04-19 ENCOUNTER — Other Ambulatory Visit: Payer: 59

## 2013-04-25 ENCOUNTER — Ambulatory Visit
Admission: RE | Admit: 2013-04-25 | Discharge: 2013-04-25 | Disposition: A | Discharge status: Home / Self Care | Payer: 59 | Source: Ambulatory Visit | Attending: Gastroenterology | Admitting: Gastroenterology

## 2013-04-25 ENCOUNTER — Other Ambulatory Visit: Payer: Self-pay

## 2013-04-25 DIAGNOSIS — R1013 Epigastric pain: Secondary | ICD-10-CM

## 2013-05-15 ENCOUNTER — Encounter: Payer: Self-pay | Admitting: *Deleted

## 2013-05-24 ENCOUNTER — Encounter: Payer: Self-pay | Admitting: Gastroenterology

## 2013-05-24 ENCOUNTER — Ambulatory Visit (INDEPENDENT_AMBULATORY_CARE_PROVIDER_SITE_OTHER): Payer: 59 | Admitting: Gastroenterology

## 2013-05-24 VITALS — BP 120/80 | HR 60 | Ht 70.0 in | Wt 158.4 lb

## 2013-05-24 DIAGNOSIS — R1011 Right upper quadrant pain: Secondary | ICD-10-CM

## 2013-05-24 DIAGNOSIS — K219 Gastro-esophageal reflux disease without esophagitis: Secondary | ICD-10-CM

## 2013-05-24 DIAGNOSIS — R109 Unspecified abdominal pain: Secondary | ICD-10-CM

## 2013-05-24 NOTE — Patient Instructions (Addendum)
Continue your Omeprazole     You have been scheduled for a HIDA scan at [facility] (1st floor) on 730 am. Please arrive 15 minutes prior to your scheduled appointment on 06-11-2013. Make certain not to have anything to eat or drink at least 6 hours prior to your test. Should this appointment date or time not work well for you, please call radiology scheduling at [phone].  _____________________________________________________________________ hepatobiliary (HIDA) scan is an imaging procedure used to diagnose problems in the liver, gallbladder and bile ducts. In the HIDA scan, a radioactive chemical or tracer is injected into a vein in your arm. The tracer is handled by the liver like bile. Bile is a fluid produced and excreted by your liver that helps your digestive system break down fats in the foods you eat. Bile is stored in your gallbladder and the gallbladder releases the bile when you eat a meal. A special nuclear medicine scanner (gamma camera) tracks the flow of the tracer from your liver into your gallbladder and small intestine.  During your HIDA scan  You'll be asked to change into a hospital gown before your HIDA scan begins. Your health care team will position you on a table, usually on your back. The radioactive tracer is then injected into a vein in your arm.The tracer travels through your bloodstream to your liver, where it's taken up by the bile-producing cells. The radioactive tracer travels with the bile from your liver into your gallbladder and through your bile ducts to your small intestine.You may feel some pressure while the radioactive tracer is injected into your vein. As you lie on the table, a special gamma camera is positioned over your abdomen taking pictures of the tracer as it moves through your body. The gamma camera takes pictures continually for about an hour. You'll need to keep still during the HIDA scan. This can become uncomfortable, but you may find that you can  lessen the discomfort by taking deep breaths and thinking about other things. Tell your health care team if you're uncomfortable. The radiologist will watch on a computer the progress of the radioactive tracer through your body. The HIDA scan may be stopped when the radioactive tracer is seen in the gallbladder and enters your small intestine. This typically takes about an hour. In some cases extra imaging will be performed if original images aren't satisfactory, if morphine is given to help visualize the gallbladder or if the medication CCK is given to look at the contraction of the gallbladder. This test typically takes 2 hours to complete. __________________________________________________________________________________________________________________________________________________________________________________________

## 2013-05-24 NOTE — Progress Notes (Signed)
.  This is a 62 year old Caucasian male who continues to have intermittent episodes of right upper quadrant pain and will occasionally awaken him from sleep the last 45 minutes to an hour in duration.  His acid reflux is controlled with daily omeprazole 20 mg.  He denies nausea vomiting, other hepatobiliary or lower gastrointestinal symptoms.  Ultrasound the abdomen was negative except for gallbladder polyp.  Endoscopy was unremarkable and biopsies for H. pylori were negative.  Current Medications, Allergies, Past Medical History, Past Surgical History, Family History and Social History were reviewed in [name].  ROS: All systems were reviewed and are negative unless otherwise stated in the HPI.          Physical Exam: Blood pressure 120/80, pulse 60 and regular weight 158 with a BMI of 22.73.  Abdominal exam shows no organomegaly, masses, or tenderness.  Bowel sounds are normal.  Mental status is normal.    Assessment and Plan: Rule out dysfunctional gallbladder problems versus IBS.  He does have GERD which is well managed with daily PPI therapy which I have asked him to continue.  We'll do CCK HIDA scan to complete his workup.  May need laparoscopic cholecystectomy if he has abnormal CCK HIDA scan and a prominent gallbladder polyp.

## 2013-06-11 ENCOUNTER — Other Ambulatory Visit (HOSPITAL_COMMUNITY): Payer: 59

## 2014-02-18 IMAGING — US US ABDOMEN COMPLETE
1 series · 13 of 25 positions shown · non-contrast
Comparison: None.

CLINICAL DATA: History of epigastric pain.

EXAM:
ULTRASOUND ABDOMEN COMPLETE

[Series 1: us abdomen complete · 0.24mm/px · 13 of 72 slices shown]
[im 1/72]
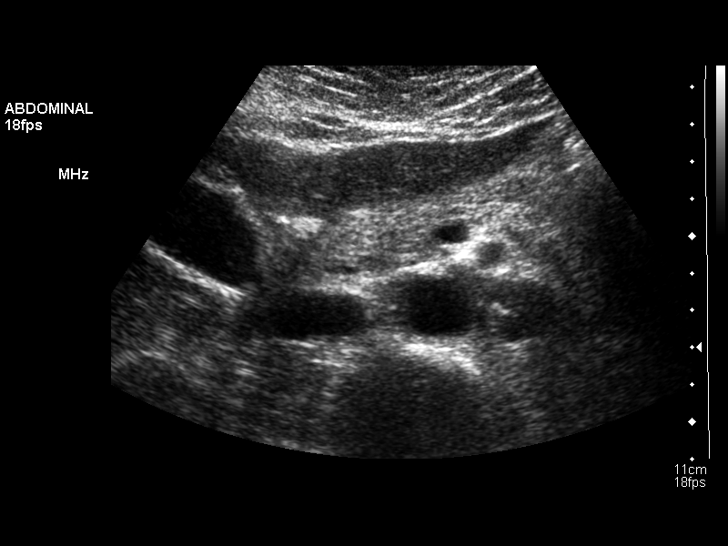
[im 6/72]
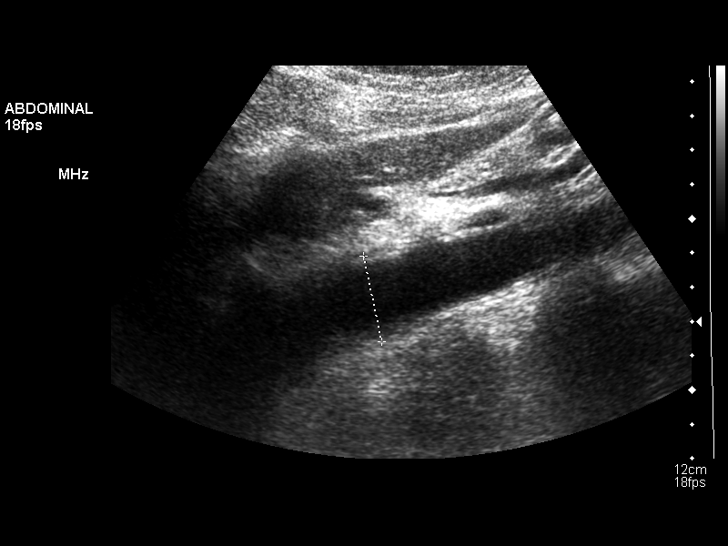
[im 12/72]
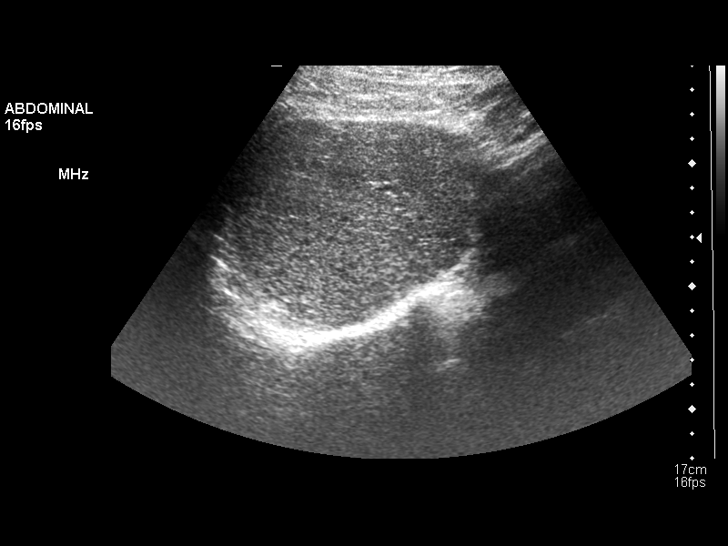
[im 18/72]
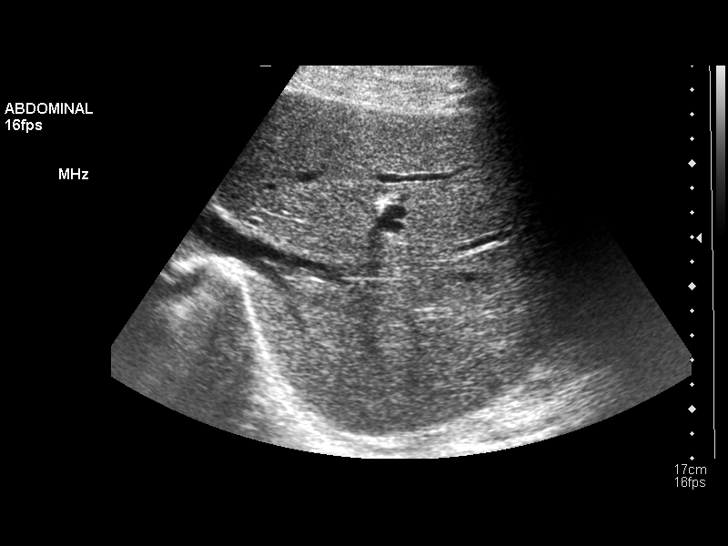
[im 24/72]
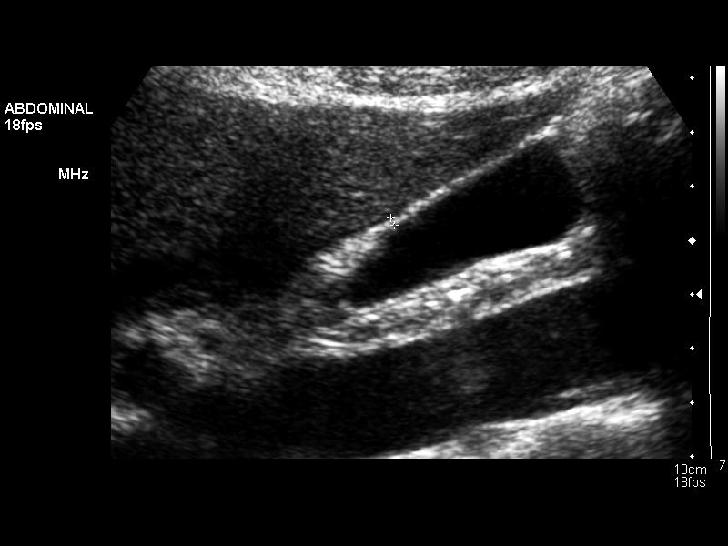
[im 30/72]
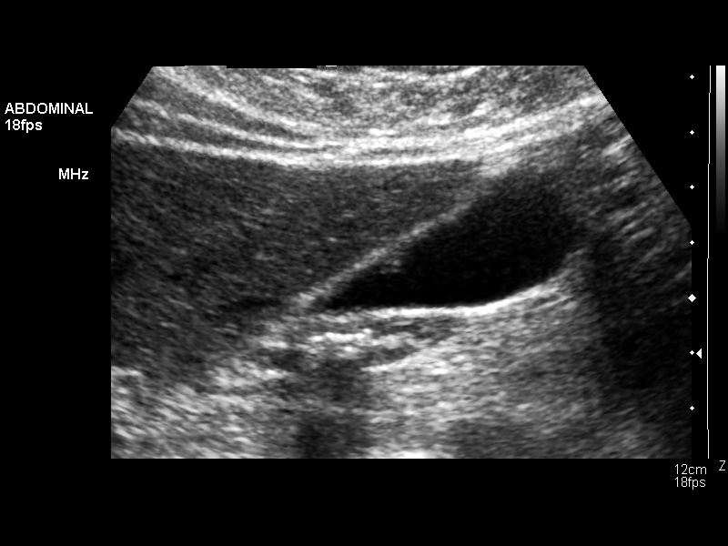
[im 36/72]
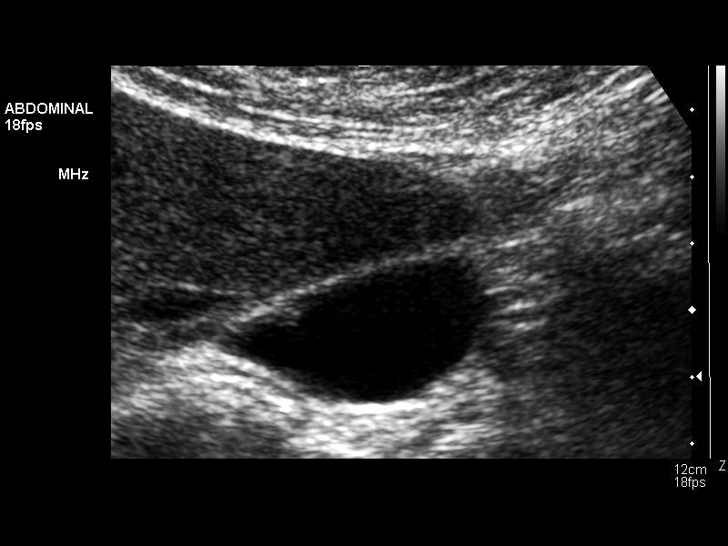
[im 42/72]
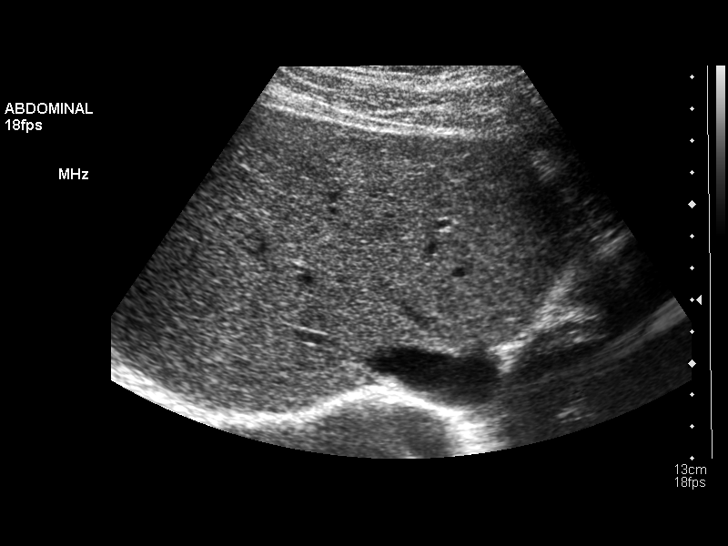
[im 48/72]
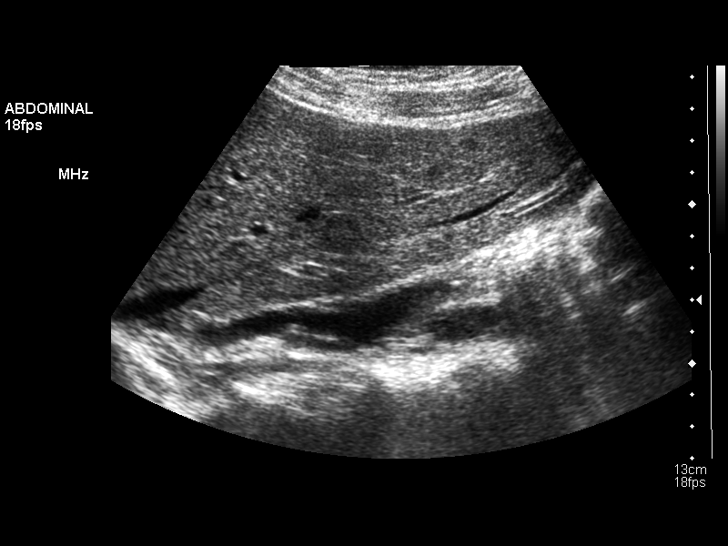
[im 54/72]
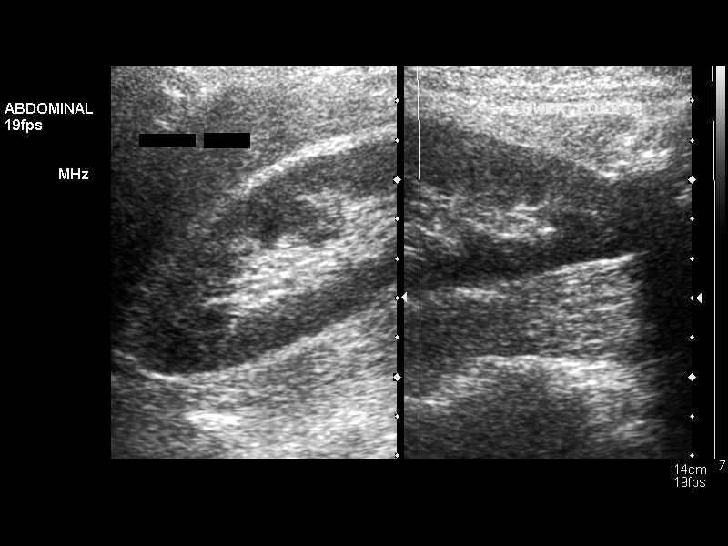
[im 60/72]
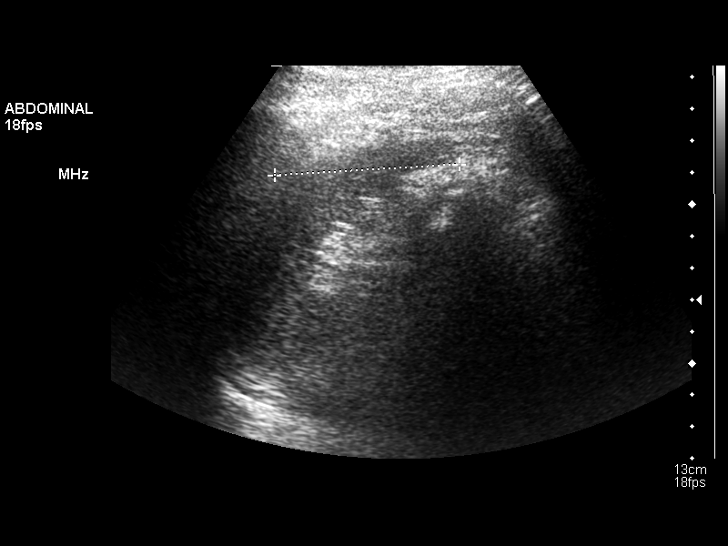
[im 66/72]
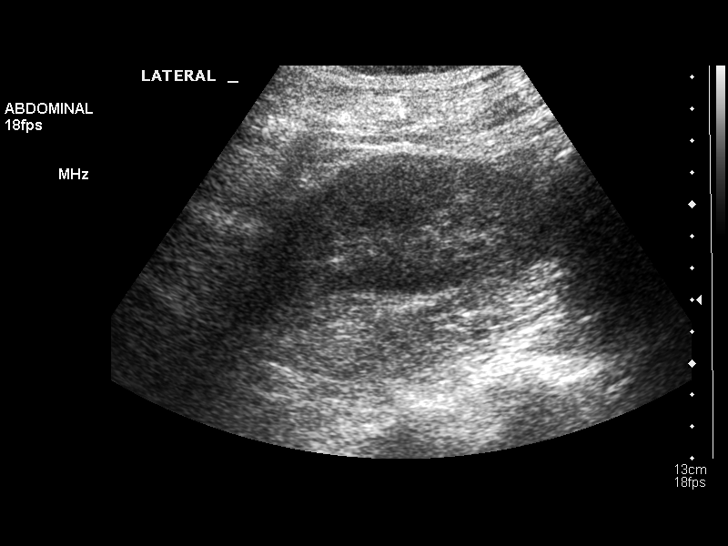
[im 72/72]
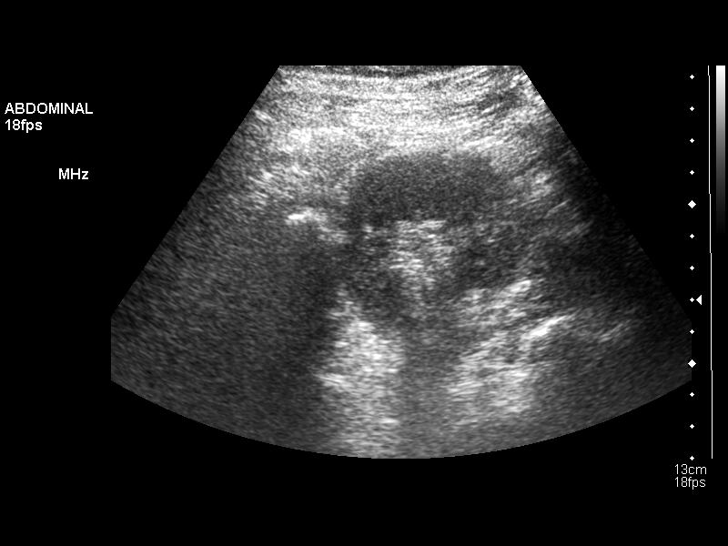

[13 of 25 positions shown; findings below may reference images not displayed]

FINDINGS: Gallbladder

There is a 4.5 mm echogenic focus contiguous with the gallbladder
mucosa. No acoustic shadowing was seen associated with it. It was
nonmobile. This is consistent with a small polyp. No gallstones or
wall thickening visualized. Gallbladder wall thickness measured
mm. No pericholecystic fluid is seen. No positive sonographic Murphy
sign noted.

Common bile duct

Diameter: 4.2 mm. No choledocholithiasis or dilatation of branching
intrahepatic bile ducts is evident.

Liver

No focal lesion identified. Within normal limits in parenchymal
echogenicity. Hepatic veins appear patent. Portal vein is patent
with hepatopetal flow.

IVC

No abnormality visualized.

Pancreas

Visualized portion unremarkable. Portions of the distal tail cannot
be completely visualized.

Spleen

Size and appearance within normal limits.  Length is 6 cm.

Right Kidney

Length: Length is 11.8 cm.. Echogenicity within normal limits. No
mass or hydronephrosis visualized.

Left Kidney

Length: Left renal length is 12.4 cm.. Echogenicity within normal
limits. No mass or hydronephrosis visualized.

Abdominal aorta

No aneurysm visualized.  Maximum diameter is 2.6 cm.
IMPRESSION: There is a small gallbladder polyp. No cholelithiasis is seen. Bile
ducts appear normal. No other abdominal pathology was identified.

## 2014-06-27 NOTE — Telephone Encounter (Signed)
Pt has not been seen since 02/2013. He will need a physical exam.

## 2014-06-27 NOTE — Telephone Encounter (Signed)
Montrice, can you close this for me? I am not authorized to close. thanks.

## 2017-03-09 ENCOUNTER — Encounter: Payer: Self-pay | Admitting: Family Medicine

## 2021-12-13 ENCOUNTER — Encounter: Payer: Self-pay | Admitting: Gastroenterology
